# Patient Record
Sex: Female | Born: 1935 | Race: White | Hispanic: No | Smoking: Former smoker
Health system: Southern US, Community
[De-identification: ages and names within clinical notes are randomized; demographics above are authoritative.]

## PROBLEM LIST (undated history)

## (undated) DIAGNOSIS — E785 Hyperlipidemia, unspecified: Secondary | ICD-10-CM

## (undated) DIAGNOSIS — F0390 Unspecified dementia without behavioral disturbance: Secondary | ICD-10-CM

## (undated) DIAGNOSIS — I493 Ventricular premature depolarization: Secondary | ICD-10-CM

## (undated) DIAGNOSIS — G629 Polyneuropathy, unspecified: Secondary | ICD-10-CM

## (undated) DIAGNOSIS — Z8679 Personal history of other diseases of the circulatory system: Secondary | ICD-10-CM

## (undated) DIAGNOSIS — F03B0 Unspecified dementia, moderate, without behavioral disturbance, psychotic disturbance, mood disturbance, and anxiety: Secondary | ICD-10-CM

## (undated) DIAGNOSIS — R42 Dizziness and giddiness: Secondary | ICD-10-CM

## (undated) DIAGNOSIS — M1990 Unspecified osteoarthritis, unspecified site: Secondary | ICD-10-CM

## (undated) DIAGNOSIS — G608 Other hereditary and idiopathic neuropathies: Secondary | ICD-10-CM

## (undated) DIAGNOSIS — I739 Peripheral vascular disease, unspecified: Secondary | ICD-10-CM

## (undated) HISTORY — PX: TONSILLECTOMY: SUR1361

## (undated) HISTORY — DX: Hyperlipidemia, unspecified: E78.5

## (undated) HISTORY — DX: Unspecified osteoarthritis, unspecified site: M19.90

## (undated) HISTORY — PX: BUNIONECTOMY: SHX129

## (undated) HISTORY — DX: Unspecified dementia without behavioral disturbance: F03.90

## (undated) HISTORY — DX: Dizziness and giddiness: R42

## (undated) HISTORY — PX: DILATION AND CURETTAGE OF UTERUS: SHX78

## (undated) HISTORY — DX: Peripheral vascular disease, unspecified: I73.9

## (undated) HISTORY — DX: Other hereditary and idiopathic neuropathies: G60.8

## (undated) HISTORY — DX: Ventricular premature depolarization: I49.3

## (undated) HISTORY — DX: Polyneuropathy, unspecified: G62.9

## (undated) HISTORY — DX: Unspecified dementia, moderate, without behavioral disturbance, psychotic disturbance, mood disturbance, and anxiety: F03.B0

## (undated) HISTORY — DX: Personal history of other diseases of the circulatory system: Z86.79

---

## 2003-04-29 ENCOUNTER — Encounter: Admission: RE | Admit: 2003-04-29 | Discharge: 2003-04-29 | Payer: Self-pay | Admitting: Orthopedic Surgery

## 2003-04-30 ENCOUNTER — Ambulatory Visit (HOSPITAL_COMMUNITY): Admission: RE | Admit: 2003-04-30 | Discharge: 2003-04-30 | Payer: Self-pay | Admitting: Orthopedic Surgery

## 2003-04-30 ENCOUNTER — Ambulatory Visit (HOSPITAL_BASED_OUTPATIENT_CLINIC_OR_DEPARTMENT_OTHER): Admission: RE | Admit: 2003-04-30 | Discharge: 2003-04-30 | Payer: Self-pay | Admitting: Orthopedic Surgery

## 2003-12-10 ENCOUNTER — Ambulatory Visit (HOSPITAL_BASED_OUTPATIENT_CLINIC_OR_DEPARTMENT_OTHER): Admission: RE | Admit: 2003-12-10 | Discharge: 2003-12-10 | Payer: Self-pay | Admitting: Orthopedic Surgery

## 2008-01-03 ENCOUNTER — Ambulatory Visit: Payer: Self-pay | Admitting: Family Medicine

## 2008-08-21 ENCOUNTER — Ambulatory Visit: Payer: Self-pay | Admitting: Family Medicine

## 2008-10-10 ENCOUNTER — Ambulatory Visit: Payer: Self-pay | Admitting: Family Medicine

## 2008-10-17 ENCOUNTER — Encounter: Payer: Self-pay | Admitting: Cardiovascular Disease

## 2009-02-11 ENCOUNTER — Ambulatory Visit: Payer: Self-pay | Admitting: Family Medicine

## 2009-09-16 ENCOUNTER — Ambulatory Visit: Payer: Self-pay | Admitting: Family Medicine

## 2009-12-29 ENCOUNTER — Encounter: Admission: RE | Admit: 2009-12-29 | Discharge: 2009-12-29 | Payer: Self-pay | Admitting: Orthopedic Surgery

## 2009-12-31 ENCOUNTER — Ambulatory Visit (HOSPITAL_BASED_OUTPATIENT_CLINIC_OR_DEPARTMENT_OTHER): Admission: RE | Admit: 2009-12-31 | Discharge: 2009-12-31 | Payer: Self-pay | Admitting: Orthopedic Surgery

## 2010-01-09 ENCOUNTER — Ambulatory Visit: Payer: Self-pay | Admitting: Family Medicine

## 2010-04-28 ENCOUNTER — Ambulatory Visit
Admission: RE | Admit: 2010-04-28 | Discharge: 2010-04-28 | Payer: Self-pay | Source: Home / Self Care | Attending: Family Medicine | Admitting: Family Medicine

## 2010-06-25 LAB — BASIC METABOLIC PANEL
CO2: 30 mEq/L (ref 19–32)
Calcium: 9.3 mg/dL (ref 8.4–10.5)
Chloride: 102 mEq/L (ref 96–112)
Creatinine, Ser: 0.54 mg/dL (ref 0.4–1.2)
GFR calc Af Amer: >60 mL/min (ref >60)
Glucose, Bld: 97 mg/dL (ref 70–99)

## 2010-08-28 NOTE — Op Note (Signed)
NAMEJONNA, [name]                        ACCOUNT NO.:  [id number]   MEDICAL RECORD NO.:  [id number]                   PATIENT TYPE:  AMB   LOCATION:  DSC                                  FACILITY:  [facility]   PHYSICIAN:  Leonides Grills, M.D.                  DATE OF BIRTH:  Feb 17, 1925   DATE OF PROCEDURE:  12/10/2003  DATE OF DISCHARGE:                                 OPERATIVE REPORT   PREOPERATIVE DIAGNOSES:  1.  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  2.  Right third toe metatarsophalangeal joint valgus contracture.   POSTOPERATIVE DIAGNOSES:  1.  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  2.  Right third toe metatarsophalangeal joint valgus contracture.   OPERATION:  1.  Right third toe proximal interphalangeal joint fusion.  2.  Stress x-rays, right foot.  3.  Right third toe metatarsophalangeal joint lateral capsular release and      medial capsular imbrication.   ANESTHESIA:  Popliteal block with sedation.   SURGEON:  Leonides Grills, M.D.   ANESTHESIA:  Lianne Cure, P.A.   ESTIMATED BLOOD LOSS:  Minimal.   TOURNIQUET TIME:  Approximately one-half hour.   COMPLICATIONS:  None.   DISPOSITION:  Stable to PAR.   INDICATIONS:  This is a 77 year old female who had a previous hammer toe  reconstruction in the past and developed the above deformity.  It was to the  point that it was interfering with her life to the point that she could not  do what she wants to do.  She was consented for the above procedure.  All  risks, which include infection, nerve or vessel injury, recurrence of  deformity, persisting pain, worsening pain, ischemia of the toe, and  possible amputation, were all explained, questions were encouraged and  answered.   OPERATION:  The patient was brought to the operating room and placed in the  supine position after adequate general endotracheal tube anesthesia was  administered as well as Ancef 1 g IV piggyback.  The  right lower extremity  was then prepped and draped in a sterile manner over a proximally-placed  thigh tourniquet.  The limb was gravity-exsanguinated and the tourniquet was  elevated to 290 mmHg.  A longitudinal incision dorsally over the right third  toe was made.  Dissection was carried down through skin.  There was a  tremendous amount of scar, and this was then meticulously dissected out both  medially and laterally.  The extensor tendon apparatus was then preserved.  This was elevated off the dorsal aspect of the PIP joint and proximal  phalanx and MTP joint.  There was a rotational deformity component to the  proximal phalanx.  MTP joint capsulotomy and collateral release was then  performed of the MTP joint.  This had excellent release of the contracture.  We then entered the PIP joint.  This was scarred into varus.  A  partial  excision of the proximal phalanx head was then performed to where it was  perpendicular to the line of the toe.  The bone was prepared for fusion on  both the base of the middle phalanx and the proximal phalanx head area,  where this resection took place.  We then repaired the medial collateral  ligament of the MTP joint with 3-0 Vicryl suture.  This had an excellent  repair and without the wire in place held the tone in an excellent position  and no-touch technique.  We then placed a 0.045 K-wire antegrade through the  middle and distal phalanx and reduced the PIP joint, compressed it, and then  fired the K-wire retrograde through the proximal phalanx and then with the  toe held in the proper position, fired the K-wire across the MTP joint.  We  then obtained stress x-rays, which showed a stable construct.  There was no  gross motion at the arthrodesis site, and the K-wire and toe were in proper  position.  Rotation of the proximal phalanx was excellent as well.  The  tourniquet was deflated, hemostasis was obtained.  Skin-relieving incisions  were made on  either side of the K-wire.  The wound was copiously irrigated  with normal saline, the skin was closed with 4-0 nylon.  The K-wires were  bent, cut, and capped.  A sterile dressing was applied, a hard-soled shoe  was applied.  The patient was stable to the PAR.                                               Leonides Grills, M.D.    PB/MEDQ  D:  12/10/2003  T:  12/10/2003  Job:  [id number]

## 2010-08-28 NOTE — Op Note (Signed)
NAMECHRYSTEN, [name]                        ACCOUNT NO.:  [id number]   MEDICAL RECORD NO.:  [id number]                   PATIENT TYPE:  AMB   LOCATION:  DSC                                  FACILITY:  [facility]   PHYSICIAN:  Leonides Grills, M.D.                  DATE OF BIRTH:  01/03/1925   DATE OF PROCEDURE:  04/30/2003  DATE OF DISCHARGE:                                 OPERATIVE REPORT   PREOPERATIVE DIAGNOSES:  1. Bilateral tight gastrocs.  2. Bilateral second to fifth hammer toes.  3. Bilateral proximal phalanx head spurs.  4. Left great toe metatarsophalangeal joint contracture.   POSTOPERATIVE DIAGNOSES:  1. Bilateral tight gastrocs.  2. Bilateral second to fifth hammer toes.  3. Bilateral proximal phalanx head spurs.  4. Left great toe metatarsophalangeal joint contracture.   OPERATION PERFORMED:  1. Bilateral gastroc slides.  2. Bilateral great toe partial excision, proximal phalanx heads.  3. Left dorsal great toe metatarsophalangeal joint capsulotomy and release.  4. Bilateral second through fifth toes metatarsophalangeal joint dorsal     capsulotomy with collateral release.  5. Bilateral second through fifth toes proximal phalanx head resections.  6. Bilateral second through fifth toes flexor digitorum longus to proximal     phalanx transfer.  7. Bilateral second through fifth toes extensor digitorum brevis to extensor     digitorum longus transfers.   SURGEON:  Leonides Grills, M.D.   ASSISTANT:  Lianne Cure, P.A.   ANESTHESIA:  General endotracheal tube.   ESTIMATED BLOOD LOSS:  Minimal.   TOURNIQUET TIME:  Approximately one hour and 15 minutes on the right and one  hour and 20 minutes on the left.   DISPOSITION:  Stable to PR.   INDICATIONS FOR PROCEDURE:  The patient is a 77 year old female with  longstanding forefoot pain that was interfering with her life to the point  where she cannot do what she wants to do despite shoe modification.  The  patient   has consented for the above procedure.  All risks which include  infection, neurovascular injury, recurrence of the deformity, persistent  pain, worsening of pain, stiffness, ischemia and possible amputation of the  toe were all explained, questions were encouraged and answered.   DESCRIPTION OF PROCEDURE:  The patient was brought to the operating room and  placed in supine position after adequate general endotracheal tube  anesthesia was administered as well as Ancef 1 g IV piggyback.  Bilateral  lower extremities were prepped and draped in sterile manner over a  proximally placed thigh tourniquet.  We started the procedure with a  longitudinal incision over the right medial head of the gastroc.  Dissection  was carried down through skin.  Hemostasis was obtained.  Fascia was opened  in line with the incision.  Interval between the contact region of the  soleus and gastroc was then developed.  Soft tissue was then elevated off  the posterior aspect of the gastrocnemius.  Sural nerve was identified and  protected posteriorly.  Gastrocnemius was then released with the Mayo  scissors. This had excellent release.  The tight gastroc wound was copiously  irrigated with normal saline.  The subcutaneous was closed with 3-0 Vicryl,  the skin was closed with 4-0 Monocryl subcuticular stitch.  The same exact  procedure was done on the left side as described above.  We then gravity  exsanguinated the right lower extremity and tourniquet was elevated to 290  mmHg.  A longitudinal incision over the dorsal medial aspect of the great  toe interphalangeal joint was then made.  Dissection was carried out through  skin.  Capsule was then opened.  Spur was then identified.  With sharp  dissection around the spur and once this was dissected out, we then removed  it with a rongeur.  This was then palpated through the skin to be adequately  decompressed.  The wound was then copiously irrigated with normal  saline.  The skin was  closed with 4-0 nylon suture.  We then made a longitudinal incision over the  second toe.  This had a previous surgery prior.  The PIP joint was solidly  fused as well.  Once we made the incision, the extensor apparatus was then  identified.  There was a large amount of scar tissue in this area.  The  extensor digitorum brevis was tenotomized distal lateral and the extensor  digitorum longus proximal and medial.  This was then retracted out of harm's  way.  The MTP joint was then identified and dorsal capsulotomy collateral  release was performed with a 15 blade scalpel protecting soft tissues and  neurovascular structures both medially and laterally.  I then identified the  PIP joint and made an osteotomy through this area with a rongeur.  Once this  was developed and we found the plantar plate where it was previously and  longitudinal incision was made in the plantar plate.  The FPL tendon was  then identified.  The distal aspect of the proximal phalanx was also  skeletonized as well.  The FDL tendon was identified and tenotomized as  distal as possible within the wound.  We then made two sequential drill  holes with a 2.5 mm and 3.5 mm drill, respectively.  We then transferred the  FDL tendon through the drill hole from plantar to dorsal using 3-0 PDS  suture.  We then placed a K-wire antegrade through the middle and distal  phalanx.  We then reduced the PIP joint and placed the K-wire retrograde  through the proximal phalanx.  The toe was held in reduced position with the  ankle in neutral dorsiflexion, tension to the FPL tendon through the drill  hole.  This was then advanced across the MTP joint with the toe held in  reduced position.  This had excellent position of the toe.  We did the exact  same procedure through separate incisions through the third, fourth and fifth toes respectively on the right foot as well as the second, third,  fourth and fifth toes of  the left foot with the tourniquet elevated.  We  also on the left foot with tourniquet elevated, made a longitudinal incision  over the medial aspect of the great toe MTP joint.  Dissection was carried  down through the skin.  Capsule was then opened.  Soft tissue was then  elevated off the dorsal aspect within the joint and the  capsule was then  released taking care not to violate the FHL tendon. This had excellent  release of the tight capsule.  We then placed a 2.0 mm K-wire retrograde  from the distal and proximal phalanx across the MTP joint to hold it in  reduced position.  At the end of each foot procedure, once the foot was  completed, the tourniquet was deflated.  Toes pinked up nicely.  Skin  relieving incisions were made over every single K-wire.  K-wires were bent,  cut and capped.  All wounds were closed with 4-0 nylon suture.  Sterile  dressing was applied.  Cam walker boots were applied.  The patient was  stable to the PR.                                               Leonides Grills, M.D.    PB/MEDQ  D:  04/30/2003  T:  05/01/2003  Job:  [id number]

## 2010-09-16 ENCOUNTER — Encounter: Payer: Self-pay | Admitting: Medical

## 2010-09-16 ENCOUNTER — Ambulatory Visit (INDEPENDENT_AMBULATORY_CARE_PROVIDER_SITE_OTHER): Payer: MEDICARE | Admitting: Medical

## 2010-09-16 VITALS — BP 130/80 | HR 69 | Temp 97.8°F | Wt 124.0 lb

## 2010-09-16 DIAGNOSIS — M542 Cervicalgia: Secondary | ICD-10-CM

## 2010-09-16 DIAGNOSIS — R51 Headache: Secondary | ICD-10-CM

## 2010-09-16 DIAGNOSIS — W19XXXA Unspecified fall, initial encounter: Secondary | ICD-10-CM

## 2010-09-16 LAB — POCT URINALYSIS DIPSTICK
Glucose, UA: NEGATIVE
Leukocytes, UA: NEGATIVE
Nitrite, UA: NEGATIVE
Urobilinogen, UA: NEGATIVE

## 2010-09-16 LAB — COMPREHENSIVE METABOLIC PANEL
Albumin: 4.1 g/dL (ref 3.5–5.2)
BUN: 12 mg/dL (ref 6–23)
CO2: 29 mEq/L (ref 19–32)
Glucose, Bld: 93 mg/dL (ref 70–99)
Sodium: 137 mEq/L (ref 135–145)
Total Bilirubin: 0.5 mg/dL (ref 0.3–1.2)
Total Protein: 6.3 g/dL (ref 6.0–8.3)

## 2010-09-16 LAB — CBC WITH DIFFERENTIAL/PLATELET
Hemoglobin: 13.4 g/dL (ref 12.0–15.0)
Lymphocytes Relative: 17 % (ref 12–46)
Lymphs Abs: 1.4 10*3/uL (ref 0.7–4.0)
MCV: 94.2 fL (ref 78.0–100.0)
Monocytes Relative: 11 % (ref 3–12)
Neutrophils Relative %: 70 % (ref 43–77)
Platelets: 250 10*3/uL (ref 150–400)
RBC: 4.29 MIL/uL (ref 3.87–5.11)
WBC: 8.3 10*3/uL (ref 4.0–10.5)

## 2010-09-16 LAB — TSH: TSH: 1.216 u[IU]/mL (ref 0.350–4.500)

## 2010-09-16 MED ORDER — TIZANIDINE HCL 4 MG PO TABS: 4.0000 mg | ORAL_TABLET | Freq: Every evening | ORAL | Status: DC | PRN

## 2010-09-16 NOTE — Progress Notes (Addendum)
Subjective:   HPI  [name] is a 77 y.o. female who presents for headache.  She lives in [facility].  She is here alone today.  She notes 3 days ago having a headache on the right side of her head.  She is worried as she thinks something is wrong.  She notes that she normally can tell when there is a change in her body.  On Monday she took OTC extra strength pain reliever which seemed to improve the headache by that evening.  She had no headache all day Tuesday, but then awoke with headache again on the right side of her head again this morning.  The headache is not severe, but concerns her.  She does see an eye doctor and has had recent eye exam with change in her glasses prescription.  She also notes some intermittent mild pain in her right neck recently.  She slept in a different position than her usual, an may have laid on her neck in different way.  Moving her neck can worsen the pain.    She notes hx/o 2-3 falls in the last few months.  Has a cane but doesn't use this regularly. She notes hx/o neuropathy, but no new focal neurologic changes recently such as numbness, vision or hearing changes, weakness, etc.  No other aggravating or relieving factors.  No other c/o.    Review of Systems Constitutional: denies fever, chills, sweats, unexpected weight change, anorexia, fatigue Allergy: negative; denies recent sneezing, itching, congestion Dermatology: +losing hair generalized for few years, thought it was stress related;  denies changing moles, rash, lumps, new worrisome lesions ENT: no runny nose, ear pain, sore throat, hoarseness, sinus pain, teeth pain, tinnitus, hearing loss, epistaxis Cardiology: +rare brief sharp mid chest pain.  denies palpitations, edema, orthopnea, paroxysmal nocturnal dyspnea Respiratory: +mild change in breathing over the last few years.   denies cough, dyspnea on exertion, wheezing, hemoptysis Gastroenterology: denies abdominal pain,  nausea, vomiting, diarrhea, constipation, blood in stool, changes in bowel movement, dysphagia Hematology: denies bleeding or bruising problems Musculoskeletal: denies arthralgias, myalgias, joint swelling, back pain, neck pain, cramping, gait changes Ophthalmology: denies vision changes, eye redness, itching, discharge Urology: denies dysuria, difficulty urinating, hematuria, urinary frequency, urgency, incontinence Neurology: +chronic >1 year numbness of anterior lower legs, "catch" in her right leg causing limp;mild memory changes, +right occipital headache;  no weakness, speech abnormality, dizziness Psychology: denies depressed mood, agitation, sleep problems     Objective:   Physical Exam  General appearance: alert, no distress, WD/WN, white female HEENT: normocephalic, conjunctiva/corneas normal, sclerae anicteric, PERRLA, EOMi, nares patent, no discharge or erythema, pharynx normal Oral cavity: MMM, tongue normal, teeth normal Neck: +mild tenderness of right lateral neck, mild pain with neck ROM with flexion and rotation to the left, otherwise supple, no lymphadenopathy, no thyromegaly, no masses, no JVD, no bruits Heart: RRR, normal S1, S2, no murmurs Lungs: CTA bilaterally, no wheezes, rhonchi, or rales Back: non tender, normal ROM, no scoliosis Musculoskeletal: upper extremities non tender, no obvious deformity, normal ROM throughout, lower extremities non tender, no obvious deformity, normal ROM throughout Extremities: no edema, no cyanosis, no clubbing Pulses: 2+ symmetric, upper and lower extremities Neurological: alert, oriented x 3, CN2-12 intact, strength normal upper extremities and lower extremities, DTRs 1+ throughout, no cerebellar signs, Romberg negative.  Antalgic gait , chronic per pt due to neuropathy and right leg problems.  Psychiatric: normal affect, behavior normal, pleasant      Assessment :  Encounter Diagnoses  Name Primary?  . Headache Yes  . Fall     . Cervicalgia       Plan:    MMSE 28/30 today.    Headache and cervicalgia - Advised that symptoms and exam currently suggestive of headache related to neck tension and cervicalgia vs other worrisome cause.  Advised rest, heat, Tizanidine QHS prn, OTC Tylenol or Aleve prn.  Labs today to further eval, recheck 2wk.  If headaches worse or continue, can consider head imaging.  Fall - advised to use her cane, discussed risks of falls, and discussed fall prevention.

## 2010-09-16 NOTE — Patient Instructions (Signed)
Use your cane to avoid fall.  Use OTC Tylenol or Aleve for headache.   Use muscle relaxer at bedtime for neck tension.  Recheck in 10 days, but return sooner if much worse or new symptoms.

## 2010-09-18 ENCOUNTER — Telehealth: Payer: Self-pay | Admitting: Medical

## 2010-09-18 NOTE — Telephone Encounter (Signed)
Is the neck the main issue currently, or is she still having the same headache pain?  Did the muscle relaxer help?  How often is she taking Aleve or Tylenol?  In general, is she worse, better, or the same?

## 2010-09-21 ENCOUNTER — Telehealth: Payer: Self-pay | Admitting: *Deleted

## 2010-09-21 NOTE — Telephone Encounter (Signed)
Pt is feeling some better. Going out of town for a week and will call to make appointment when she returns.  Has been taking muscle relaxer and it seems to help some.  CM, LPN

## 2010-09-21 NOTE — Telephone Encounter (Addendum)
Message copied by Dorthula Perfect on Mon Sep 21, 2010 12:38 PM ------      Message from: Aleen Campi, DAVID S      Created: Thu Sep 17, 2010  8:17 AM       All of her labs were normal for blood counts, kidney, liver, lytes, urine.  Thus, c/t plan to try the Tizanidine muscle relaxer at night, OTC Aleve or Tylenol for headache, and recheck in 2wk.  However, call/return sooner if worsening or new symptoms that concern her.    Pt notified of lab results.  Pt states that she has been taking Tizanidine muscle relaxer at night for the neck pain.  Pt is going out of town for a week and will call when she returns to schedule a follow up visit.  CM, LPN

## 2010-10-09 ENCOUNTER — Ambulatory Visit (INDEPENDENT_AMBULATORY_CARE_PROVIDER_SITE_OTHER): Payer: MEDICARE | Admitting: Medical

## 2010-10-09 ENCOUNTER — Encounter: Payer: Self-pay | Admitting: Medical

## 2010-10-09 VITALS — BP 130/72 | HR 64 | Temp 98.8°F | Ht 65.0 in | Wt 130.0 lb

## 2010-10-09 DIAGNOSIS — I998 Other disorder of circulatory system: Secondary | ICD-10-CM

## 2010-10-09 DIAGNOSIS — R51 Headache: Secondary | ICD-10-CM

## 2010-10-09 DIAGNOSIS — R300 Dysuria: Secondary | ICD-10-CM

## 2010-10-09 DIAGNOSIS — R21 Rash and other nonspecific skin eruption: Secondary | ICD-10-CM

## 2010-10-09 DIAGNOSIS — R58 Hemorrhage, not elsewhere classified: Secondary | ICD-10-CM

## 2010-10-09 LAB — POCT URINALYSIS DIPSTICK
Leukocytes, UA: NEGATIVE
Nitrite, UA: NEGATIVE
Spec Grav, UA: 1.01
Urobilinogen, UA: NEGATIVE

## 2010-10-09 NOTE — Progress Notes (Signed)
Subjective:   HPI  [name] is a 77 y.o. female who presents for recheck on headache.  She lives in [facility].  She is here alone today.  I saw her on 09/16/10 for new c/o headache on the right side of her head.  She notes occasional neck tension, mild pain with turning her neck to the left that radiates up the scalp.  We had her try some muscle relaxer and OTC analgesia at last visit.  We had also gotten some baseline labs.  Since last visit she has had no more headache. She does get occasional mild pain in her neck on the right, worse with rotation of the neck.  She use to do Yoga and some weight training, but not of recent.  There are facilities where she lives but she hasn't used them regularly.  She also c/o rash on her left leg.  Denies injury or trauma.  She does report some mild urinary discomfort.   She notes hx/o 2-3 falls in the last few months.  Has a cane but doesn't use this regularly. She notes hx/o neuropathy, but no new focal neurologic changes recently such as numbness, vision or hearing changes, weakness, etc.  No other aggravating or relieving factors.  No other c/o.   Past Medical History  Diagnosis Date  . Dyslipidemia   . Alopecia   . Arthritis   . Hereditary peripheral neuropathy     neurology eval 3/12  . Hereditary neuropathy with pressure palsies (HNPP)     Review of Systems Constitutional: denies fever, chills, sweats, unexpected weight change, anorexia, fatigue Allergy: negative; denies recent sneezing, itching, congestion Dermatology: +losing hair generalized for few years, thought it was stress related;  denies changing moles, rash, lumps, new worrisome lesions ENT: no runny nose, ear pain, sore throat, hoarseness, sinus pain, teeth pain, tinnitus, hearing loss, epistaxis Cardiology: +rare brief sharp mid chest pain.  denies palpitations, edema, orthopnea, paroxysmal nocturnal dyspnea Respiratory: +mild change in breathing over the  last few years.   denies cough, dyspnea on exertion, wheezing, hemoptysis Gastroenterology: denies abdominal pain, nausea, vomiting, diarrhea, constipation, blood in stool, changes in bowel movement, dysphagia Hematology: denies bleeding or bruising problems Musculoskeletal: denies arthralgias, myalgias, joint swelling, back pain, neck pain, cramping, gait changes Ophthalmology: denies vision changes, eye redness, itching, discharge Urology: denies dysuria, difficulty urinating, hematuria, urinary frequency, urgency, incontinence Neurology: +chronic >1 year numbness of anterior lower legs, "catch" in her right leg causing limp;mild memory changes, +right occipital headache;  no weakness, speech abnormality, dizziness Psychology: denies depressed mood, agitation, sleep problems     Objective:   Physical Exam  General appearance: alert, no distress, WD/WN, white female Skin: left upper leg just proximal to knee joint with 1.5 cm round erythematous lesion, blanches and appears to be ecchymosis but the somewhat heterogenous and round uniform appearance looks somewhat atypical and more like tinea HEENT: normocephalic, conjunctiva/corneas normal, sclerae anicteric, PERRLA, EOMi, nares patent, no discharge or erythema, pharynx normal Oral cavity: MMM, tongue normal, teeth normal Neck: +mild tenderness of right lateral neck, mild pain with neck ROM with flexion and rotation to the left, otherwise supple, no lymphadenopathy, no thyromegaly, no masses, no JVD, no bruits Heart: RRR, normal S1, S2, no murmurs Lungs: CTA bilaterally, no wheezes, rhonchi, or rales Extremities: no edema, no cyanosis, no clubbing Pulses: 2+ symmetric, upper and lower extremities Neurological: alert, oriented x 3, CN2-12 intact   Assessment :    Encounter Diagnoses  Name  Primary?  . Headache Yes  . Ecchymosis   . Dysuria   . Rash       Plan:  Headache  - resolved.  I suspect she has some cervical arthritis  causing some of the symptoms.  Advised she get back into low weight strength training and Yoga at the retirement center.  Rash - KOH negative, thus, rash actually appears to be ecchymosis although initially it seems a bit ambiguous.  Ecchymosis - advised ice pack prn, and this should gradually resolve.  Dysuria - no significant findings on U/A today.   Fall - advised to use her cane, discussed risks of falls, and discussed fall prevention.

## 2010-10-13 ENCOUNTER — Encounter: Payer: Self-pay | Admitting: Family Medicine

## 2010-11-20 ENCOUNTER — Ambulatory Visit (INDEPENDENT_AMBULATORY_CARE_PROVIDER_SITE_OTHER): Payer: MEDICARE | Admitting: Medical

## 2010-11-20 ENCOUNTER — Encounter: Payer: Self-pay | Admitting: Medical

## 2010-11-20 VITALS — BP 120/76 | HR 60 | Temp 98.7°F | Resp 16 | Ht 65.0 in | Wt 129.0 lb

## 2010-11-20 DIAGNOSIS — J069 Acute upper respiratory infection, unspecified: Secondary | ICD-10-CM

## 2010-11-20 MED ORDER — AZITHROMYCIN 250 MG PO TABS: ORAL_TABLET | ORAL | Status: AC

## 2010-11-20 NOTE — Progress Notes (Signed)
  Subjective:   HPI  LASHAE Trujillo is a 77 y.o. female who presents for c/o mild "flu like symptoms".  Bad runny nose since yesterday, had bad cough with yellow productive sputum.   Using something OTC for runny nose but not sure of the name.  Its a one a day pill.  No other aggravating or relieving factors.  No other c/o.  The following portions of the patient's history were reviewed and updated as appropriate: allergies, current medications, past family history, past medical history, past social history, past surgical history and problem list.  Past Medical History  Diagnosis Date  . Dyslipidemia   . Alopecia   . Arthritis   . Hereditary peripheral neuropathy     neurology eval 3/12  . Hereditary neuropathy with pressure palsies (HNPP)     Review of Systems Constitutional: +anorexia; denies fever, chills, sweats, unexpected weight change ENT: +runny nose;  no ear pain, sore throat, hoarseness, sinus pain Cardiology: denies chest pain, palpitations, edema Respiratory: +cough with some production; denies shortness of breath, wheezing Gastroenterology: denies abdominal pain, nausea, vomiting, diarrhea, constipation Musculoskeletal: denies arthralgias, myalgias, joint swelling, back pain Urology: denies dysuria, difficulty urinating, hematuria, urinary frequency, urgency     Objective:   Physical Exam  General appearance: alert, no distress, WD/WN, white female, mildly ill appearing HEENT: normocephalic, sclerae anicteric, PERRLA, EOMi, nares patent, no discharge or erythema, pharynx normal Oral cavity: MMM, no lesions Neck: supple, no lymphadenopathy, no thyromegaly, no masses Heart: RRR, normal S1, S2, no murmurs Lungs: CTA bilaterally, no wheezes, rhonchi, or rales Abdomen: +bs, soft, non tender, non distended, no masses, no hepatomegaly, no splenomegaly Extremities: no edema, no cyanosis, no clubbing Pulses: 2+ symmetric, upper and lower extremities, normal cap  refill Neurological: alert, oriented x 3 Psychiatric: normal affect, behavior normal, pleasant    Assessment :    Encounter Diagnosis  Name Primary?  . URI (upper respiratory infection) Yes     Plan:    Currently etiology seems viral.  Exam is normal.   Discussed possible etiologies and treatment.  Patient Instructions  Rest, hydrate well, use OTC Mucinex DM or Coricidin HBP.  If worse cough and productive sputum over the weekend, begin Azithromycin antibiotic.  Call Monday if not better.

## 2010-11-20 NOTE — Patient Instructions (Signed)
Rest, hydrate well, use OTC Mucinex DM or Coricidin HBP.  If worse cough and productive sputum over the weekend, begin Azithromycin antibiotic.  Call Monday if not better.

## 2010-12-11 ENCOUNTER — Encounter: Payer: Self-pay | Admitting: Family Medicine

## 2011-01-20 ENCOUNTER — Encounter: Payer: Self-pay | Admitting: Family Medicine

## 2011-01-20 ENCOUNTER — Ambulatory Visit (INDEPENDENT_AMBULATORY_CARE_PROVIDER_SITE_OTHER): Payer: MEDICARE | Admitting: Family Medicine

## 2011-01-20 VITALS — BP 150/84 | HR 60 | Ht 65.0 in | Wt 134.0 lb

## 2011-01-20 DIAGNOSIS — R030 Elevated blood-pressure reading, without diagnosis of hypertension: Secondary | ICD-10-CM

## 2011-01-20 DIAGNOSIS — R42 Dizziness and giddiness: Secondary | ICD-10-CM

## 2011-01-20 DIAGNOSIS — H6120 Impacted cerumen, unspecified ear: Secondary | ICD-10-CM

## 2011-01-20 NOTE — Patient Instructions (Signed)
You may take meclizine (Bonine is a brand of this medication).  It is over the counter, available in 12.5 mg.  Since I think it may have made you hyper in the past, you can try taking just 1/2 tablet.  You may take it every 6-8 hours--take it only if you need it for dizziness/balance problems/vertigo.    You may try taking Claritin (plain, NOT the "D") if you continue to have any runny nose, congestion.  You may also continue the Mucinex DM if needed for ongoing cough.  You have Toprol XL listed on your medication list.  It sounds as though you are not taking this medication.  Please check your bottles and see who originally prescribed this for you.  I do not know if you need to restart it.  Today, your blood pressure was a little high.  Follow up with Dr. Susann Givens in 2 weeks for a repeat blood pressure, to help [place] decide if, in fact, you need to take this medication.

## 2011-01-20 NOTE — Progress Notes (Signed)
Patient presents, accompanied by her son, with balance problems x 2 days. Awoke Monday morning with a "swimmy head" and a "buzzing" and "going in circles" on the top of her head.  Didn't last for long, was better by the time she went to the bathroom.  Continues to have some balance problem--further classified as "can't walk well" and scared she's going to fall.  She has neuropathy and limps with her right leg.  She doesn't think the neuropathy has anything to do with her walking problem.  Thinks it is related to her balance. Recalls having similar problem in the past (she states 3 years ago).  Chart was reviewed--similar problem with short-lived vertigo occurred the same time last year.  Previously saw 2 neurologists--didn't like the one in [place] who did a lot of tests and told her to f/u in 6 months.  So her son took her to [facility] instead.  Bottom of her legs are numb, and she can't dorsiflex it fully with right foot.  Sh denies any pain from her neuropathy.  Having some URI symptoms x 2 days.  Runny nose, sore throat just in the mornings (relieved by gargling).  Some cough. Denies ear pain, sinus pain or pressure, shortness of breath or wheezes, tinnitus.  Denies nausea, vomiting or diarrhea.  Denies skin rash. Taking Mucinex DM which seems to be helping dry up the mucus.  Chart was reviewed, med list reviewed.  Patient states she stopped taking the propecia for alopecia because it wasn't really working.  She stopped the Zocor because of concerns of it affecting her brain/thinking.  She doesn't recall much about the Toprol XL--states she isn't taking it now, doesn't recall who prescribed it (noted in neuro's records from 06/2010).  States that her BP was normal last week at the oral surgeon's office.  Her son (and his wife) had some concerns about her speech, concern regarding other relatives with stroke  Past Medical History  Diagnosis Date  . Dyslipidemia   . Alopecia   . Arthritis   .  Hereditary peripheral neuropathy     neurology eval 3/12  . Hereditary neuropathy with pressure palsies (HNPP)     No past surgical history on file.  History   Social History  . Marital Status: Widowed    Spouse Name: N/A    Number of Children: N/A  . Years of Education: N/A   Occupational History  . Not on file.   Social History Main Topics  . Smoking status: Never Smoker   . Smokeless tobacco: Not on file  . Alcohol Use: No  . Drug Use: No  . Sexually Active:    Other Topics Concern  . Not on file   Social History Narrative  . No narrative on file   Current outpatient prescriptions:Ascorbic Acid (VITAMIN C) 1000 MG tablet, Take 1,000 mg by mouth daily.  , Disp: , Rfl: ;  aspirin 325 MG tablet, Take 325 mg by mouth daily.  , Disp: , Rfl: ;  Biotin 5000 MCG CAPS, Take 1 capsule by mouth.  , Disp: , Rfl: ;  calcium citrate-vitamin D 200-200 MG-UNIT TABS, Take 1 tablet by mouth 2 (two) times a week.  , Disp: , Rfl:  co-enzyme Q-10 30 MG capsule, Take 50 mg by mouth 1 dose over 24 hours.  , Disp: , Rfl: ;  COD LIVER OIL PO, Take 1 capsule by mouth 1 dose over 24 hours.  , Disp: , Rfl: ;  folic acid (FOLVITE)  800 MCG tablet, Take 400 mcg by mouth daily.  , Disp: , Rfl: ;  Garlic 1000 MG CAPS, Take 1 capsule by mouth.  , Disp: , Rfl: ;  Glucosamine-Chondroit-Vit C-Mn (GLUCOSAMINE 1500 COMPLEX PO), Take 2 capsules by mouth 1 dose over 24 hours.  , Disp: , Rfl:  metoprolol succinate (TOPROL-XL) 25 MG 24 hr tablet, Take 25 mg by mouth daily.  , Disp: , Rfl: ;  Multiple Vitamins-Minerals (MATURE ADULT CENTURY PO), Take 1 capsule by mouth.  , Disp: , Rfl: ;  Multiple Vitamins-Minerals (OCUVITE EYE HEALTH FORMULA) CAPS, Take 2 capsules by mouth 2 (two) times daily.  , Disp: , Rfl: ;  finasteride (PROSCAR) 5 MG tablet, Take 5 mg by mouth daily. 1/4 tablet daily (for hair loss), Disp: , Rfl:  tiZANidine (ZANAFLEX) 4 MG tablet, Take 1 tablet (4 mg total) by mouth at bedtime as needed., Disp: 20  tablet, Rfl: 0  No Known Allergies  ROS:  Denies fevers, shortness of breath, chest pain, nausea, vomiting, diarrhea, skin rash.  Denies pain.  See HPI  PHYSICAL EXAM: BP 150/84  Pulse 60  Ht 5\' 5"  (1.651 m)  Wt 134 lb (60.782 kg)  BMI 22.30 kg/m2 Pleasant, elderly female in no distress. HEENT: PERRL, EOMI, conjunctiva clear.  TM's and EAC's--normal on right.  Obscured by cerumen left.  After lavage and removal with curette, TM was visualized and was normal.  OP clear without erythema.  Sinuses nontender; nasal mucosa mildly edematous, no erythema or purulence. Neck: no lymphadenopathy or mass Heart: regular rate and rhythm without murmur Lungs: clear bilaterally Extremities: no edema Neuro: alert and oriented.  Cranial nerves 2-12 intact.  Normal strength except diminished in both hands/fingers, and significant decrease in dorsiflexion R foot.  Negative Romberg, no pronator drift.  Normal finger to nose.  Speech is fluent, understandable.  No trouble with word finding or other speech abnormality noted today Skin: no rash  ASSESSMENT/PLAN:  1. Vertigo    2. Cerumen impaction  Ear cerumen removal  3. Elevated BP     ? history of HTN, ? whether she is supposed to be taking Toprol XL (hasn't been)   Meclizine prn.  Claritin prn congestion, continue Mucinex DM prn cough F/u 1-2 weeks with Dr. Susann Givens, her regular physician, on her blood pressure (and ? Need for Toprol).  Also, to further address son's concerns about patient's speech.  Reassured today

## 2011-01-25 ENCOUNTER — Ambulatory Visit: Payer: PRIVATE HEALTH INSURANCE | Admitting: Family Medicine

## 2011-01-29 ENCOUNTER — Ambulatory Visit (INDEPENDENT_AMBULATORY_CARE_PROVIDER_SITE_OTHER): Payer: MEDICARE | Admitting: Family Medicine

## 2011-01-29 ENCOUNTER — Encounter: Payer: Self-pay | Admitting: Family Medicine

## 2011-01-29 VITALS — BP 140/90 | HR 64 | Wt 133.0 lb

## 2011-01-29 DIAGNOSIS — R413 Other amnesia: Secondary | ICD-10-CM

## 2011-01-29 DIAGNOSIS — R42 Dizziness and giddiness: Secondary | ICD-10-CM

## 2011-01-29 DIAGNOSIS — R269 Unspecified abnormalities of gait and mobility: Secondary | ICD-10-CM

## 2011-01-29 LAB — CBC WITH DIFFERENTIAL/PLATELET
Basophils Absolute: 0 10*3/uL (ref 0.0–0.1)
Eosinophils Relative: 3 % (ref 0–5)
Lymphocytes Relative: 19 % (ref 12–46)
MCV: 94.3 fL (ref 78.0–100.0)
Platelets: 304 10*3/uL (ref 150–400)
RDW: 13.3 % (ref 11.5–15.5)
WBC: 8.7 10*3/uL (ref 4.0–10.5)

## 2011-01-29 LAB — COMPREHENSIVE METABOLIC PANEL
ALT: 14 U/L (ref 0–35)
AST: 21 U/L (ref 0–37)
Calcium: 9.9 mg/dL (ref 8.4–10.5)
Chloride: 99 mEq/L (ref 96–112)
Creat: 0.61 mg/dL (ref 0.50–1.10)
Total Bilirubin: 0.4 mg/dL (ref 0.3–1.2)

## 2011-01-29 MED ORDER — METOPROLOL SUCCINATE ER 25 MG PO TB24: 25.0000 mg | ORAL_TABLET | Freq: Every day | ORAL | Status: DC

## 2011-01-29 NOTE — Progress Notes (Signed)
  Subjective:    Patient ID: [name], female    DOB: 10-15-1924, 77 y.o.   MRN: [id number]  HPI She is here for a recheck. She continues to have difficulty with word searching and memory loss which has been intermittent in nature.. She's also been unsteady on her feet. This has apparently been going on for at least a month. She and her son and daughter-in-law at all noticed this. She also states that she finds herself leaning against the wall to prevent herself from falling backwards. She is on multiple medications and asked me to review them. I recommended that she stop several of the OTC meds that were of questionable value.  Review of Systems     Objective:   Physical Exam alert and in no distress. Tympanic membranes and canals are normal. Throat is clear. Tonsils are normal. Neck is supple without adenopathy or thyromegaly. Cardiac exam shows a regular sinus rhythm without murmurs or gallops. Lungs are clear to auscultation. Cerebellar testing did note falling backwards. Reflexes are normal. EOMI.       Assessment & Plan:   1. Memory loss or impairment  MR Brain W Wo Contrast, CBC with Differential, Comprehensive metabolic panel, TSH, Folate, B12  2. Postural dizziness  MR Brain W Wo Contrast, TSH, Folate, B12  3. Gait disturbance  MR Brain W Wo Contrast, TSH, Folate, B12  4. Hypertension. Continue metoprolol. Prescription was called in.

## 2011-01-29 NOTE — Patient Instructions (Signed)
I called in your metoprolol. We will call you with the results of the blood work and the MRI when it is done

## 2011-02-01 ENCOUNTER — Telehealth: Payer: Self-pay | Admitting: Family Medicine

## 2011-02-01 NOTE — Telephone Encounter (Signed)
Okay fracture

## 2011-02-01 NOTE — Telephone Encounter (Signed)
Dr Susann Givens can she have xray?

## 2011-02-02 NOTE — Telephone Encounter (Signed)
Added xray to [facility].  Pt aware of MRI & xray.  Faxed to [facility] with labs

## 2011-02-03 ENCOUNTER — Ambulatory Visit
Admission: RE | Admit: 2011-02-03 | Discharge: 2011-02-03 | Disposition: A | Discharge status: Home / Self Care | Payer: MEDICARE | Source: Ambulatory Visit | Attending: Family Medicine | Admitting: Family Medicine

## 2011-02-03 DIAGNOSIS — R269 Unspecified abnormalities of gait and mobility: Secondary | ICD-10-CM

## 2011-02-03 DIAGNOSIS — R413 Other amnesia: Secondary | ICD-10-CM

## 2011-02-03 DIAGNOSIS — R42 Dizziness and giddiness: Secondary | ICD-10-CM

## 2011-02-03 MED ORDER — GADOBENATE DIMEGLUMINE 529 MG/ML IV SOLN
12.0000 mL | Freq: Once | INTRAVENOUS | Status: AC | PRN
  Administered 2011-02-03: 12 mL via INTRAVENOUS

## 2011-02-08 ENCOUNTER — Ambulatory Visit (INDEPENDENT_AMBULATORY_CARE_PROVIDER_SITE_OTHER): Payer: MEDICARE | Admitting: Family Medicine

## 2011-02-08 ENCOUNTER — Encounter: Payer: Self-pay | Admitting: Family Medicine

## 2011-02-08 VITALS — BP 120/90 | HR 42 | Wt 132.0 lb

## 2011-02-08 DIAGNOSIS — M25511 Pain in right shoulder: Secondary | ICD-10-CM

## 2011-02-08 DIAGNOSIS — M25519 Pain in unspecified shoulder: Secondary | ICD-10-CM

## 2011-02-08 DIAGNOSIS — R5383 Other fatigue: Secondary | ICD-10-CM

## 2011-02-08 NOTE — Progress Notes (Signed)
  Subjective:    Patient ID: [name], female    DOB: 25-Mar-1925, 77 y.o.   MRN: [id number]  HPI She is here for consult. She had a recent MRI which was essentially negative. Her main concern today is difficulty with fatigue. She admits to not being very active where she lives. She also is having intermittent right shoulder pain. She has had difficulty with this in the past and apparently has had surgical intervention. I have no report concerning this.   Review of Systems     Objective:   Physical Exam Alert and in no distress. Good motion of the shoulder. No laxity is noted. Hawkin's test did cause some crepitus.       Assessment & Plan:   1. Right shoulder pain   2. Fatigue    recommend conservative care for her shoulder and if continued difficulty, refer to orthopedics. Also encouraged her to become more physically active to help with what is probably deconditioning causing her fatigue.

## 2011-02-08 NOTE — Patient Instructions (Addendum)
do some exercise programs where you live; that should help with your energy and stamina Use Tylenol for your shoulder pain and if it continues ,call me and I will refer you to your orthopedic surgeon

## 2011-05-20 ENCOUNTER — Ambulatory Visit (INDEPENDENT_AMBULATORY_CARE_PROVIDER_SITE_OTHER): Payer: MEDICARE | Admitting: Family Medicine

## 2011-05-20 ENCOUNTER — Encounter: Payer: Self-pay | Admitting: Family Medicine

## 2011-05-20 VITALS — BP 120/80 | HR 60 | Ht 63.0 in | Wt 132.0 lb

## 2011-05-20 DIAGNOSIS — R42 Dizziness and giddiness: Secondary | ICD-10-CM

## 2011-05-20 MED ORDER — METOPROLOL SUCCINATE ER 25 MG PO TB24: 25.0000 mg | ORAL_TABLET | Freq: Every day | ORAL | Status: DC

## 2011-05-20 MED ORDER — MECLIZINE HCL 12.5 MG PO TABS: 12.5000 mg | ORAL_TABLET | Freq: Two times a day (BID) | ORAL | Status: DC | PRN

## 2011-05-20 NOTE — Patient Instructions (Signed)
Use the meclizine but be very careful because it can make you drowsy.

## 2011-05-20 NOTE — Progress Notes (Signed)
  Subjective:    Patient ID: [name], female    DOB: [birth year], 78 y.o.   MRN: [id number]  HPI She is here for evaluation of dizziness. She states she was slightly dizzy yesterday and also had some nausea. She woke up this morning and again had an episode of dizziness but also associated with transient visual changes. Now she is doing better. Review of her record indicates that she's had previous difficulty with dizziness and has had a brain MRI which was essentially negative except for atrophy. She also then noted that she has had difficulty with dizziness intermittently for the last several months. No blood or vision, double vision, headache, weakness, numbness, tingling, heart rate changes  Review of Systems     Objective:   Physical Exam alert and in no distress. EOMI. Other cranial nerves grossly intact. Cerebellar testing did show some instability but in no particular direction Tympanic membranes and canals are normal. Throat is clear. Tonsils are normal. Neck is supple without adenopathy or thyromegaly. Cardiac exam shows a regular sinus rhythm without murmurs or gallops. Lungs are clear to auscultation.       Assessment & Plan:   1. Vertigo    I will give her meclizine but cautioned her to use this very sparingly because of sedation. She will keep in touch with me.

## 2011-06-10 ENCOUNTER — Telehealth: Payer: Self-pay | Admitting: Internal Medicine

## 2011-06-10 NOTE — Telephone Encounter (Signed)
I discussed this with her. She is to add Allegra to her regimen if no better come in tomorrow for possible ENT referral

## 2011-06-11 ENCOUNTER — Telehealth: Payer: Self-pay | Admitting: Family Medicine

## 2011-06-11 NOTE — Telephone Encounter (Signed)
She has had some difficulty with intermittent dizziness. This seems to have cleared up at present time but she does describe word searching. She will make an appointment for further evaluation of this.

## 2011-06-14 ENCOUNTER — Ambulatory Visit (INDEPENDENT_AMBULATORY_CARE_PROVIDER_SITE_OTHER): Payer: MEDICARE | Admitting: Family Medicine

## 2011-06-14 ENCOUNTER — Encounter: Payer: Self-pay | Admitting: Family Medicine

## 2011-06-14 ENCOUNTER — Other Ambulatory Visit: Payer: Self-pay

## 2011-06-14 VITALS — BP 124/70 | HR 60 | Wt 134.0 lb

## 2011-06-14 DIAGNOSIS — L309 Dermatitis, unspecified: Secondary | ICD-10-CM

## 2011-06-14 DIAGNOSIS — R42 Dizziness and giddiness: Secondary | ICD-10-CM

## 2011-06-14 DIAGNOSIS — L259 Unspecified contact dermatitis, unspecified cause: Secondary | ICD-10-CM

## 2011-06-14 MED ORDER — TACROLIMUS 0.1 % EX OINT: TOPICAL_OINTMENT | Freq: Two times a day (BID) | CUTANEOUS | Status: DC

## 2011-06-14 NOTE — Patient Instructions (Signed)
Call if any further problems

## 2011-06-14 NOTE — Progress Notes (Signed)
  Subjective:    Patient ID: [name], female    DOB: 09-03-1924, 78 y.o.   MRN: [id number]  HPI She is here for reevaluation of dizziness. She did have occult he was this last weekend has been using meclizine. Today she is feeling better. She cannot relate the dizziness to head position area she's had no numbness, tingling, weakness, blurred or double vision. She would also like her protopic prescription renewed. She did see Dr. Terri Piedra for this and would like me to refill it for her. She mainly has trouble around her lips.   Review of Systems     Objective:   Physical Exam alert and in no distress. EOMI. DTR's wnl.Tympanic membranes and canals are normal. Throat is clear. Tonsils are normal. Neck is supple without adenopathy or thyromegaly. Cardiac exam shows a regular sinus rhythm without murmurs or gallops. Lungs are clear to auscultation. Exam shows no changes       Assessment & Plan:   1. Dermatitis  tacrolimus (PROTOPIC) 0.1 % ointment  2. Vertigo     I explained to her and her son that her dizziness symptoms were more likely related to or ear issues. There was no indicators of problems in any other area. They're comfortable with this. She will continue to use the meclizine as needed

## 2011-07-01 ENCOUNTER — Encounter: Payer: Self-pay | Admitting: *Deleted

## 2011-11-11 ENCOUNTER — Ambulatory Visit (INDEPENDENT_AMBULATORY_CARE_PROVIDER_SITE_OTHER): Payer: MEDICARE | Admitting: Family Medicine

## 2011-11-11 ENCOUNTER — Encounter: Payer: Self-pay | Admitting: Family Medicine

## 2011-11-11 VITALS — BP 102/70 | HR 60 | Wt 131.0 lb

## 2011-11-11 DIAGNOSIS — H918X9 Other specified hearing loss, unspecified ear: Secondary | ICD-10-CM

## 2011-11-11 DIAGNOSIS — R5383 Other fatigue: Secondary | ICD-10-CM

## 2011-11-11 DIAGNOSIS — H6120 Impacted cerumen, unspecified ear: Secondary | ICD-10-CM

## 2011-11-11 DIAGNOSIS — R5381 Other malaise: Secondary | ICD-10-CM

## 2011-11-11 DIAGNOSIS — R413 Other amnesia: Secondary | ICD-10-CM

## 2011-11-11 LAB — CBC WITH DIFFERENTIAL/PLATELET
Eosinophils Relative: 6 % — ABNORMAL HIGH (ref 0–5)
HCT: 40.8 % (ref 36.0–46.0)
Hemoglobin: 13.8 g/dL (ref 12.0–15.0)
Lymphocytes Relative: 26 % (ref 12–46)
Lymphs Abs: 1.8 10*3/uL (ref 0.7–4.0)
MCV: 92.5 fL (ref 78.0–100.0)
Monocytes Absolute: 0.7 10*3/uL (ref 0.1–1.0)
RBC: 4.41 MIL/uL (ref 3.87–5.11)
WBC: 7.1 10*3/uL (ref 4.0–10.5)

## 2011-11-11 LAB — COMPREHENSIVE METABOLIC PANEL
ALT: 13 U/L (ref 0–35)
Albumin: 4 g/dL (ref 3.5–5.2)
BUN: 11 mg/dL (ref 6–23)
CO2: 30 mEq/L (ref 19–32)
Calcium: 9.2 mg/dL (ref 8.4–10.5)
Chloride: 100 mEq/L (ref 96–112)
Creat: 0.53 mg/dL (ref 0.50–1.10)

## 2011-11-11 LAB — TSH: TSH: 1.146 u[IU]/mL (ref 0.350–4.500)

## 2011-11-11 NOTE — Progress Notes (Signed)
  Subjective:    Patient ID: [name], female    DOB: 12/03/1924, 78 y.o.   MRN: [id number]  HPI  She is here for consult concerning multiple issues. She is having difficulty with bilateral hearing loss and thinks she has cerumen impaction. She would like this cleaned out. She also complains of difficulty with decreased energy as well as difficulty finding certain words. Her relatives of also noted this. She's had no skin or hair changes, constipation. No headache, blurred vision. She continues on medications listed in the chart.  Review of Systems     Objective:   Physical Exam alert and in no distress. Tympanic membranes and canals are normal after cerumen was removed from both canals.. Throat is clear. Tonsils are normal. Neck is supple without adenopathy or thyromegaly. Cardiac exam shows a regular sinus rhythm without murmurs or gallops. Lungs are clear to auscultation.  [id number]      Assessment & Plan:   1. Fatigue  CBC with Differential, Comprehensive metabolic panel, TSH  2. Memory impairment  CBC with Differential, Comprehensive metabolic panel, TSH  3. Hearing loss secondary to cerumen impaction  Ear cerumen removal    I discussed the fact that her MMSE is normal. Did state that in the future medication might possibly be useful. I will do routine screening including thyroid function.

## 2011-11-12 NOTE — Progress Notes (Signed)
Called pt home # cell # Pt informed labs normal

## 2012-02-15 ENCOUNTER — Ambulatory Visit (INDEPENDENT_AMBULATORY_CARE_PROVIDER_SITE_OTHER): Payer: MEDICARE | Admitting: Family Medicine

## 2012-02-15 ENCOUNTER — Encounter: Payer: Self-pay | Admitting: Family Medicine

## 2012-02-15 VITALS — BP 110/70 | HR 68 | Wt 131.0 lb

## 2012-02-15 DIAGNOSIS — R413 Other amnesia: Secondary | ICD-10-CM

## 2012-02-15 DIAGNOSIS — M542 Cervicalgia: Secondary | ICD-10-CM

## 2012-02-15 NOTE — Progress Notes (Signed)
  Subjective:    Patient ID: [name], female    DOB: July 21, 1924, 78 y.o.   MRN: [id number]  HPI She has a nine-month history of right-sided lower neck pain. The pain now radiates up to the top of her head and is intermittent in nature. It can last up to an hour. She's had no blurred or double vision, weakness or slurred speech. Aleve does help. She and her son have also noted difficulty with memory. This usually has to do with recent memory.   Review of Systems     Objective:   Physical Exam alert and in no distress. EOMI. No carotid bruits noted. DTRs normal. Tympanic membranes and canals are normal. Throat is clear. Tonsils are normal. Neck is supple without adenopathy or thyromegaly. Cardiac exam shows a regular sinus rhythm without murmurs or gallops. Lungs are clear to auscultation.  MMSE 29      Assessment & Plan:   1. Neck pain on right side   2. Memory loss    recommending conservative care for the neck pain. Explained that her symptoms and exam did not point towards any particular major problem. Recommend Aleve. Also discussed the memory loss. I discussed medications but at this time do not feel it is appropriate.

## 2012-02-18 ENCOUNTER — Other Ambulatory Visit: Payer: Self-pay | Admitting: Family Medicine

## 2012-02-18 NOTE — Telephone Encounter (Signed)
IS THIS OK 

## 2012-04-27 ENCOUNTER — Other Ambulatory Visit: Payer: Self-pay | Admitting: Family Medicine

## 2012-04-27 DIAGNOSIS — R413 Other amnesia: Secondary | ICD-10-CM

## 2012-04-28 ENCOUNTER — Ambulatory Visit
Admission: RE | Admit: 2012-04-28 | Discharge: 2012-04-28 | Disposition: A | Discharge status: Home / Self Care | Payer: MEDICARE | Source: Ambulatory Visit | Attending: Family Medicine | Admitting: Family Medicine

## 2012-04-28 ENCOUNTER — Other Ambulatory Visit: Payer: Self-pay

## 2012-04-28 DIAGNOSIS — R413 Other amnesia: Secondary | ICD-10-CM

## 2012-04-28 MED ORDER — METOPROLOL SUCCINATE ER 25 MG PO TB24: 25.0000 mg | ORAL_TABLET | Freq: Every day | ORAL | Status: DC

## 2012-08-31 DIAGNOSIS — Z0289 Encounter for other administrative examinations: Secondary | ICD-10-CM

## 2012-09-08 ENCOUNTER — Telehealth: Payer: Self-pay | Admitting: Internal Medicine

## 2012-09-08 NOTE — Telephone Encounter (Signed)
[facility]. Laural Benes @ [phone]

## 2012-09-18 ENCOUNTER — Ambulatory Visit: Payer: MEDICARE | Attending: Family Medicine | Admitting: Internal Medicine

## 2012-09-18 VITALS — BP 144/75 | HR 78 | Temp 98.0°F | Resp 17 | Wt 126.4 lb

## 2012-09-18 DIAGNOSIS — F0390 Unspecified dementia without behavioral disturbance: Secondary | ICD-10-CM | POA: Insufficient documentation

## 2012-09-18 LAB — CBC WITH DIFFERENTIAL/PLATELET
Basophils Absolute: 0 10*3/uL (ref 0.0–0.1)
Basophils Relative: 0 % (ref 0–1)
Eosinophils Relative: 3 % (ref 0–5)
HCT: 43.1 % (ref 36.0–46.0)
Lymphocytes Relative: 14 % (ref 12–46)
MCHC: 33.9 g/dL (ref 30.0–36.0)
MCV: 91.9 fL (ref 78.0–100.0)
Monocytes Absolute: 0.7 10*3/uL (ref 0.1–1.0)
Neutro Abs: 6.3 10*3/uL (ref 1.7–7.7)
Platelets: 266 10*3/uL (ref 150–400)
RDW: 14 % (ref 11.5–15.5)
WBC: 8.4 10*3/uL (ref 4.0–10.5)

## 2012-09-18 LAB — LIPID PANEL
HDL: 73 mg/dL (ref >39)
LDL Cholesterol: 164 mg/dL — ABNORMAL HIGH (ref 0–99)
Total CHOL/HDL Ratio: 3.6 Ratio
Triglycerides: 113 mg/dL (ref <150)

## 2012-09-18 LAB — COMPREHENSIVE METABOLIC PANEL
AST: 20 U/L (ref 0–37)
Alkaline Phosphatase: 70 U/L (ref 39–117)
BUN: 14 mg/dL (ref 6–23)
Creat: 0.7 mg/dL (ref 0.50–1.10)
Glucose, Bld: 90 mg/dL (ref 70–99)
Total Bilirubin: 0.5 mg/dL (ref 0.3–1.2)

## 2012-09-18 NOTE — Progress Notes (Signed)
Patient here to establish care  

## 2012-09-18 NOTE — Patient Instructions (Signed)
Dementia Dementia is a general term for problems with brain function. A person with dementia has memory loss and a hard time with at least one other brain function such as thinking, speaking, or problem solving. Dementia can affect social functioning, how you do your job, your mood, or your personality. The changes may be hidden for a long time. The earliest forms of this disease are usually not detected by family or friends. Dementia can be:  Irreversible.  Potentially reversible.  Partially reversible.  Progressive. This means it can get worse over time. CAUSES  Irreversible dementia causes may include:  Degeneration of brain cells (Alzheimer's disease or lewy body dementia).  Multiple small strokes (vascular dementia).  Infection (chronic meningitis or Creutzfelt-Jakob disease).  Frontotemporal dementia. This affects younger people, age 40 to 70, compared to those who have Alzheimer's disease.  Dementia associated with other disorders like Parkinson's disease, Huntington's disease, or HIV-associated dementia. Potentially or partially reversible dementia causes may include:  Medicines.  Metabolic causes such as excessive alcohol intake, vitamin B12 deficiency, or thyroid disease.  Masses or pressure in the brain such as a tumor, blood clot, or hydrocephalus. SYMPTOMS  Symptoms are often hard to detect. Family members or coworkers may not notice them early in the disease process. Different people with dementia may have different symptoms. Symptoms can include:  A hard time with memory, especially recent memory. Long-term memory may not be impaired.  Asking the same question multiple times or forgetting something someone just said.  A hard time speaking your thoughts or finding certain words.  A hard time solving problems or performing familiar tasks (such as how to use a telephone).  Sudden changes in mood.  Changes in personality, especially increasing moodiness or  mistrust.  Depression.  A hard time understanding complex ideas that were never a problem in the past. DIAGNOSIS  There are no specific tests for dementia.   Your caregiver may recommend a thorough evaluation. This is because some forms of dementia can be reversible. The evaluation will likely include a physical exam and getting a detailed history from you and a family member. The history often gives the best clues and suggestions for a diagnosis.  Memory testing may be done. A detailed brain function evaluation called neuropsychologic testing may be helpful.  Lab tests and brain imaging (such as a CT scan or MRI scan) are sometimes important.  Sometimes observation and re-evaluation over time is very helpful. TREATMENT  Treatment depends on the cause.   If the problem is a vitamin deficiency, it may be helped or cured with supplements.  For dementias such as Alzheimer's disease, medicines are available to stabilize or slow the course of the disease. There are no cures for this type of dementia.  Your caregiver can help direct you to groups, organizations, and other caregivers to help with decisions in the care of you or your loved one. HOME CARE INSTRUCTIONS The care of individuals with dementia is varied and dependent upon the progression of the dementia. The following suggestions are intended for the person living with, or caring for, the person with dementia.  Create a safe environment.  Remove the locks on bathroom doors to prevent the person from accidentally locking himself or herself in.  Use childproof latches on kitchen cabinets and any place where cleaning supplies, chemicals, or alcohol are kept.  Use childproof covers in unused electrical outlets.  Install childproof devices to keep doors and windows secured.  Remove stove knobs or install safety   knobs and an automatic shut-off on the stove.  Lower the temperature on water heaters.  Label medicines and keep them  locked up.  Secure knives, lighters, matches, power tools, and guns, and keep these items out of reach.  Keep the house free from clutter. Remove rugs or anything that might contribute to a fall.  Remove objects that might break and hurt the person.  Make sure lighting is good, both inside and outside.  Install grab rails as needed.  Use a monitoring device to alert you to falls or other needs for help.  Reduce confusion.  Keep familiar objects and people around.  Use night lights or dim lights at night.  Label items or areas.  Use reminders, notes, or directions for daily activities or tasks.  Keep a simple, consistent routine for waking, meals, bathing, dressing, and bedtime.  Create a calm, quiet environment.  Place large clocks and calendars prominently.  Display emergency numbers and home address near all telephones.  Use cues to establish different times of the day. An example is to open curtains to let the natural light in during the day.   Use effective communication.  Choose simple words and short sentences.  Use a gentle, calm tone of voice.  Be careful not to interrupt.  If the person is struggling to find a word or communicate a thought, try to provide the word or thought.  Ask one question at a time. Allow the person ample time to answer questions. Repeat the question again if the person does not respond.  Reduce nighttime restlessness.  Provide a comfortable bed.  Have a consistent nighttime routine.  Ensure a regular walking or physical activity schedule. Involve the person in daily activities as much as possible.  Limit napping during the day.  Limit caffeine.  Attend social events that stimulate rather than overwhelm the senses.  Encourage good nutrition and hydration.  Reduce distractions during meal times and snacks.  Avoid foods that are too hot or too cold.  Monitor chewing and swallowing ability.  Continue with routine vision,  hearing, dental, and medical screenings.  Only give over-the-counter or prescription medicines as directed by the caregiver.  Monitor driving abilities. Do not allow the person to drive when safe driving is no longer possible.  Register with an identification program which could provide location assistance in the event of a missing person situation. SEEK MEDICAL CARE IF:   New behavioral problems start such as moodiness, aggressiveness, or seeing things that are not there (hallucinations).  Any new problem with brain function happens. This includes problems with balance, speech, or falling a lot.  Problems with swallowing develop.  Any symptoms of other illness happen. Small changes or worsening in any aspect of brain function can be a sign that the illness is getting worse. It can also be a sign of another medical illness such as infection. Seeing a caregiver right away is important. SEEK IMMEDIATE MEDICAL CARE IF:   A fever develops.  New or worsened confusion develops.  New or worsened sleepiness develops.  Staying awake becomes hard to do. Document Released: 09/22/2000 Document Revised: 06/21/2011 Document Reviewed: 08/24/2010 Exit[employer] Patient Information 2014 [employer].  

## 2012-09-18 NOTE — Progress Notes (Signed)
Patient ID: [name], female   DOB: 12-Jan-1925, 79 y.o.   MRN: [id number]   CC: memory problem   HPI:  The patient in 79 year old female who presents to clinic with main concern of difficulty with memory. She is here with her son and explains that she is having difficulty recalling certain things, forgets what she was planning to do, forgets to do things that she had assigned to herself to do. This has been going on for several years now but progressively worse over the past several months. Patient denies chest pain or shortness of breath, no specific abdominal or urinary concerns, no changes in appetite, no changes in weight. Patient denies any recent sicknesses or hospitalizations. Patient also tells me she's not taking any medicine. Patient explains she is trying to exercise daily by walking and is watching what she is eating. She checks her blood pressure regularly and tells me it has been very well controlled.  No Known Allergies Past Medical History  Diagnosis Date  . Dyslipidemia   . Alopecia   . Arthritis   . Hereditary peripheral neuropathy(356.0)     neurology eval 3/12  . Hereditary neuropathy with pressure palsies (HNPP)   . PVC (premature ventricular contraction)     history  . Vertigo history  . Neuropathy     history  . Hyperlipidemia   . Hx of mitral valve prolapse     mild prolapse with mitral regurgitation   Current Outpatient Prescriptions on File Prior to Visit  Medication Sig Dispense Refill  . aspirin 325 MG tablet Take 325 mg by mouth daily.      . meclizine (ANTIVERT) 12.5 MG tablet TAKE ONE TABLET TWICE DAILY AS NEEDED  30 tablet  0  . metoprolol succinate (TOPROL-XL) 25 MG 24 hr tablet Take 1 tablet (25 mg total) by mouth daily.  60 tablet  5  . Multiple Vitamins-Minerals (OCUVITE EYE HEALTH FORMULA) CAPS Take 2 capsules by mouth 2 (two) times daily.         No current facility-administered medications on file prior to visit.   Family History   Problem Relation Age of Onset  . Other Father     heart valve infection  . Stroke Mother    History   Social History  . Marital Status: Widowed    Spouse Name: N/A    Number of Children: N/A  . Years of Education: N/A   Occupational History  . Not on file.   Social History Main Topics  . Smoking status: Former Smoker -- 1.00 packs/day    Quit date: 07/01/1963  . Smokeless tobacco: Not on file  . Alcohol Use: No  . Drug Use: Not on file  . Sexually Active:    Other Topics Concern  . Not on file   Social History Narrative  . No narrative on file    Review of Systems  Constitutional: Negative for fever, chills, diaphoresis, activity change, appetite change and fatigue.  HENT: Negative for ear pain, nosebleeds, congestion, facial swelling, rhinorrhea, neck pain, neck stiffness and ear discharge.   Eyes: Negative for pain, discharge, redness, itching and visual disturbance.  Respiratory: Negative for cough, choking, chest tightness, shortness of breath, wheezing and stridor.   Cardiovascular: Negative for chest pain, palpitations and leg swelling.  Gastrointestinal: Negative for abdominal distention.  Genitourinary: Negative for dysuria, urgency, frequency, hematuria, flank pain, decreased urine volume, difficulty urinating and dyspareunia.  Musculoskeletal: Negative for back pain, joint swelling, arthralgias and gait problem.  Neurological: Negative for dizziness, tremors, seizures, syncope, facial asymmetry, speech difficulty, weakness, light-headedness, numbness and headaches.  Hematological: Negative for adenopathy. Does not bruise/bleed easily.  Psychiatric/Behavioral: Negative for hallucinations, behavioral problems, confusion, dysphoric mood, decreased concentration and agitation.    Objective:   Filed Vitals:   09/18/12 0914  BP: 144/75  Pulse: 78  Temp: 98 F (36.7 C)  Resp: 17    Physical Exam  Constitutional: Appears well-developed and well-nourished.  No distress.  HENT: Normocephalic. External right and left ear normal. Oropharynx is clear and moist.  Eyes: Conjunctivae and EOM are normal. PERRLA, no scleral icterus.  Neck: Normal ROM. Neck supple. No JVD. No tracheal deviation. No thyromegaly.  CVS: RRR, S1/S2 +, no murmurs, no gallops, no carotid bruit.  Pulmonary: Effort and breath sounds normal, no stridor, rhonchi, wheezes, rales.  Abdominal: Soft. BS +,  no distension, tenderness, rebound or guarding.  Musculoskeletal: Normal range of motion. No edema and no tenderness.  Lymphadenopathy: No lymphadenopathy noted, cervical, inguinal. Neuro: Alert. Normal reflexes, muscle tone coordination. No cranial nerve deficit. Mini-Mental Status exam patient scored 26/30 and basically missed recalling objects after 5 minutes Skin: Skin is warm and dry. No rash noted. Not diaphoretic. No erythema. No pallor.  Psychiatric: Normal mood and affect. Behavior, judgment, thought content normal.   Lab Results  Component Value Date   WBC 7.1 11/11/2011   HGB 13.8 11/11/2011   HCT 40.8 11/11/2011   MCV 92.5 11/11/2011   PLT 282 11/11/2011   Lab Results  Component Value Date   CREATININE 0.53 11/11/2011   BUN 11 11/11/2011   NA 136 11/11/2011   K 4.4 11/11/2011   CL 100 11/11/2011   CO2 30 11/11/2011    No results found for this basename: HGBA1C   Lipid Panel  No results found for this basename: chol, trig, hdl, cholhdl, vldl, ldlcalc       Assessment and plan:   Dementia - Based on my physical exam in Mini-Mental Status, possible early dementia exists but overall patient is relatively healthy - Patient looks much younger than the stated age - I will check TSH, B12, electrolyte panel today - We have discussed medicine such as Namenda which patient was taken in the past but she's not currently open to the idea of taking medicine - neurology consultation requested by pt's son

## 2012-10-20 ENCOUNTER — Encounter: Payer: Self-pay | Admitting: Diagnostic Neuroimaging

## 2012-10-20 ENCOUNTER — Ambulatory Visit (INDEPENDENT_AMBULATORY_CARE_PROVIDER_SITE_OTHER): Payer: Medicare Other | Admitting: Diagnostic Neuroimaging

## 2012-10-20 VITALS — BP 141/84 | HR 72 | Temp 98.0°F | Ht 62.0 in | Wt 133.5 lb

## 2012-10-20 DIAGNOSIS — F0390 Unspecified dementia without behavioral disturbance: Secondary | ICD-10-CM

## 2012-10-20 MED ORDER — DONEPEZIL HCL 10 MG PO TABS: 10.0000 mg | ORAL_TABLET | Freq: Every day | ORAL | Status: AC

## 2012-10-20 MED ORDER — SERTRALINE HCL 25 MG PO TABS: 25.0000 mg | ORAL_TABLET | Freq: Every day | ORAL | Status: DC

## 2012-10-20 MED ORDER — DONEPEZIL HCL 5 MG PO TABS: 5.0000 mg | ORAL_TABLET | Freq: Every day | ORAL | Status: DC

## 2012-10-20 NOTE — Patient Instructions (Addendum)
Start donepezil 5mg  at bedtime x 4 weeks, then increase to 10mg  at bedtime.  Start sertraline 25mg  daily.
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
## 2012-10-23 ENCOUNTER — Telehealth: Payer: Self-pay | Admitting: Diagnostic Neuroimaging

## 2012-10-23 NOTE — Telephone Encounter (Signed)
Pts son called and pt is having dizziness with the zoloft.   She took on Saturday, relayed to son that she was having dizziness and son told her to stop the medication today and she is better.  Continues with the aricept. She lives at [facility], uses [facility].

## 2012-10-24 ENCOUNTER — Encounter: Payer: Self-pay | Admitting: Family Medicine

## 2012-10-24 DIAGNOSIS — R413 Other amnesia: Secondary | ICD-10-CM | POA: Insufficient documentation

## 2012-10-24 DIAGNOSIS — G629 Polyneuropathy, unspecified: Secondary | ICD-10-CM | POA: Insufficient documentation

## 2012-10-24 DIAGNOSIS — Z0000 Encounter for general adult medical examination without abnormal findings: Secondary | ICD-10-CM | POA: Insufficient documentation

## 2012-11-03 ENCOUNTER — Telehealth: Payer: Self-pay | Admitting: *Deleted

## 2012-11-03 NOTE — Telephone Encounter (Signed)
Message copied by Hermenia Fiscal on Fri Nov 03, 2012 11:01 AM ------      Message from: Seth Bake      Created: Thu Nov 02, 2012  2:47 PM      Contact: Son       Carmin Richmond, patients son, called last week regarding dizziness caused by the Zoloft prescribed.  She discontinued use of the Zoloft.  What could she take?  Would like a return call today please.  ------

## 2012-11-03 NOTE — Telephone Encounter (Signed)
Dr. Marjory Lies out today.   He does have message about zoloft.

## 2012-11-06 ENCOUNTER — Telehealth: Payer: Self-pay | Admitting: Family Medicine

## 2012-11-06 NOTE — Telephone Encounter (Signed)
Calling to know whether to come in and be seen for constipation.

## 2012-11-07 ENCOUNTER — Telehealth: Payer: Self-pay | Admitting: *Deleted

## 2012-11-07 NOTE — Telephone Encounter (Signed)
11/07/12 Patient states that she already have some medication . P.Mayford Knife, RN BSN [facility]

## 2012-11-14 NOTE — Telephone Encounter (Signed)
I called and spoke with pt.  She stopped the zoloft and dizziness went away.  I told her that she needed to f/u with pcp or psychiatry re: depression.  She stated that she does not have a pcp.  She has reminded her son, Nida Boatman about getting her one.  It seems that no one wants to see Medicare pts.   I tried his cell [phone] this was temporarily disconnected and then home #.  He answered and I relayed the above information.  Recommended several pcp ([facility], [facility], [facility]).  He verbalized understanding.

## 2012-11-14 NOTE — Telephone Encounter (Signed)
Ok to stay off zoloft. F/u with PCP or psychiatry re: depression. -VRP

## 2012-12-27 ENCOUNTER — Telehealth: Payer: Self-pay | Admitting: Family Medicine

## 2013-01-10 ENCOUNTER — Ambulatory Visit: Payer: MEDICARE

## 2013-02-07 ENCOUNTER — Encounter (HOSPITAL_COMMUNITY): Payer: Self-pay | Admitting: Emergency Medicine

## 2013-02-07 ENCOUNTER — Emergency Department (HOSPITAL_COMMUNITY)
Admission: EM | Admit: 2013-02-07 | Discharge: 2013-02-07 | Disposition: A | Discharge status: Home / Self Care | Payer: MEDICARE | Attending: Emergency Medicine | Admitting: Emergency Medicine

## 2013-02-07 ENCOUNTER — Emergency Department (HOSPITAL_COMMUNITY): Payer: MEDICARE

## 2013-02-07 DIAGNOSIS — F0390 Unspecified dementia without behavioral disturbance: Secondary | ICD-10-CM | POA: Insufficient documentation

## 2013-02-07 DIAGNOSIS — G608 Other hereditary and idiopathic neuropathies: Secondary | ICD-10-CM | POA: Insufficient documentation

## 2013-02-07 DIAGNOSIS — Z87891 Personal history of nicotine dependence: Secondary | ICD-10-CM | POA: Insufficient documentation

## 2013-02-07 DIAGNOSIS — I4949 Other premature depolarization: Secondary | ICD-10-CM | POA: Insufficient documentation

## 2013-02-07 DIAGNOSIS — Z872 Personal history of diseases of the skin and subcutaneous tissue: Secondary | ICD-10-CM | POA: Insufficient documentation

## 2013-02-07 DIAGNOSIS — M129 Arthropathy, unspecified: Secondary | ICD-10-CM | POA: Insufficient documentation

## 2013-02-07 DIAGNOSIS — R413 Other amnesia: Secondary | ICD-10-CM

## 2013-02-07 DIAGNOSIS — I059 Rheumatic mitral valve disease, unspecified: Secondary | ICD-10-CM | POA: Insufficient documentation

## 2013-02-07 DIAGNOSIS — E871 Hypo-osmolality and hyponatremia: Secondary | ICD-10-CM | POA: Insufficient documentation

## 2013-02-07 DIAGNOSIS — Z7982 Long term (current) use of aspirin: Secondary | ICD-10-CM | POA: Insufficient documentation

## 2013-02-07 DIAGNOSIS — Z79899 Other long term (current) drug therapy: Secondary | ICD-10-CM | POA: Insufficient documentation

## 2013-02-07 LAB — COMPREHENSIVE METABOLIC PANEL
ALT: 16 U/L (ref 0–35)
AST: 24 U/L (ref 0–37)
Albumin: 3.7 g/dL (ref 3.5–5.2)
Alkaline Phosphatase: 71 U/L (ref 39–117)
BUN: 9 mg/dL (ref 6–23)
Chloride: 95 mEq/L — ABNORMAL LOW (ref 96–112)
GFR calc Af Amer: >90 mL/min (ref >90)
Glucose, Bld: 102 mg/dL — ABNORMAL HIGH (ref 70–99)
Potassium: 4.3 mEq/L (ref 3.5–5.1)
Sodium: 131 mEq/L — ABNORMAL LOW (ref 135–145)
Total Bilirubin: 0.5 mg/dL (ref 0.3–1.2)
Total Protein: 6.6 g/dL (ref 6.0–8.3)

## 2013-02-07 LAB — APTT: aPTT: 28 seconds (ref 24–37)

## 2013-02-07 LAB — DIFFERENTIAL
Basophils Relative: 0 % (ref 0–1)
Eosinophils Absolute: 0.1 10*3/uL (ref 0.0–0.7)
Lymphocytes Relative: 16 % (ref 12–46)
Lymphs Abs: 1.3 10*3/uL (ref 0.7–4.0)
Monocytes Relative: 11 % (ref 3–12)
Neutro Abs: 5.8 10*3/uL (ref 1.7–7.7)
Neutrophils Relative %: 72 % (ref 43–77)

## 2013-02-07 LAB — URINALYSIS, ROUTINE W REFLEX MICROSCOPIC
Bilirubin Urine: NEGATIVE
Hgb urine dipstick: NEGATIVE
Ketones, ur: NEGATIVE mg/dL
Specific Gravity, Urine: 1.013 (ref 1.005–1.030)
Urobilinogen, UA: 0.2 mg/dL (ref 0.0–1.0)
pH: 7 (ref 5.0–8.0)

## 2013-02-07 LAB — PROTIME-INR
INR: 1.04 (ref 0.00–1.49)
Prothrombin Time: 13.4 seconds (ref 11.6–15.2)

## 2013-02-07 LAB — CBC
HCT: 40.4 % (ref 36.0–46.0)
Hemoglobin: 14 g/dL (ref 12.0–15.0)
MCH: 32.3 pg (ref 26.0–34.0)
MCHC: 34.7 g/dL (ref 30.0–36.0)
Platelets: 269 10*3/uL (ref 150–400)
RBC: 4.33 MIL/uL (ref 3.87–5.11)
WBC: 8.1 10*3/uL (ref 4.0–10.5)

## 2013-02-07 NOTE — ED Provider Notes (Signed)
CSN: [id number]     Arrival date & time 02/07/13  1159 History   First MD Initiated Contact with Patient 02/07/13 1216     Chief Complaint  Patient presents with  . Altered Mental Status    HPI Pt states she was feeling sick a couple of days ago.  The symptoms resolved and she was able to vote this morning.  She was back at her living facility today and she felt that something was wrong.  Pt states she just didn't feel right.  She cannot tell me what specifically occurred.  She just feels that something is wrong with her head.  She denies trouble with her memory.  She states her speech is affected and having trouble finding some words.  No trouble with balance or coordination.  Pt is not sure if she has a doctor or has seen someone one. Past Medical History  Diagnosis Date  . Dyslipidemia   . Alopecia   . Arthritis   . Hereditary peripheral neuropathy(356.0)     neurology eval 3/12  . Hereditary neuropathy with pressure palsies (HNPP)   . PVC (premature ventricular contraction)     history  . Vertigo history  . Neuropathy     history  . Hyperlipidemia   . Hx of mitral valve prolapse     mild prolapse with mitral regurgitation   Past Surgical History  Procedure Laterality Date  . Dilation and curettage of uterus    . Tonsillectomy    . Bunionectomy     Family History  Problem Relation Age of Onset  . Other Father     heart valve infection  . Stroke Mother   . Diabetes Mother    History  Substance Use Topics  . Smoking status: Former Smoker -- 1.00 packs/day for 8 years    Quit date: 07/01/1963  . Smokeless tobacco: Not on file  . Alcohol Use: No   OB History   Grav Para Term Preterm Abortions TAB SAB Ect Mult Living                 Review of Systems  Allergies  Review of patient's allergies indicates no known allergies.  Home Medications   Current Outpatient Rx  Name  Route  Sig  Dispense  Refill  . donepezil (ARICEPT) 10 MG tablet   Oral   Take 1 tablet  (10 mg total) by mouth at bedtime.   30 tablet   12   . metoprolol succinate (TOPROL-XL) 25 MG 24 hr tablet   Oral   Take 1 tablet (25 mg total) by mouth daily.   60 tablet   5   . Multiple Vitamins-Minerals (OCUVITE EYE HEALTH FORMULA) CAPS   Oral   Take 2 capsules by mouth 2 (two) times daily.           . Ascorbic Acid (VITAMIN C) 1000 MG tablet   Oral   Take 1,000 mg by mouth daily.         Marland Kitchen aspirin 325 MG tablet   Oral   Take 325 mg by mouth daily.         . Biotin 5000 MCG CAPS   Oral   Take 5,000 mcg by mouth daily.         . Calcium Carbonate-Vit D-Min (CALCIUM 1200 PO)   Oral   Take by mouth.         . cholecalciferol (VITAMIN D) 1000 UNITS tablet   Oral   Take  1,000 Units by mouth daily.         . COD LIVER OIL PO   Oral   Take by mouth.         . Coenzyme Q10 (CO Q-10) 50 MG CAPS   Oral   Take by mouth.         . fish oil-omega-3 fatty acids 1000 MG capsule   Oral   Take 1,200 mg by mouth daily.         . folic acid (FOLVITE) 800 MCG tablet   Oral   Take 400 mcg by mouth daily.         . Garlic 1000 MG CAPS   Oral   Take by mouth.         . Glucosamine-Chondroit-Vit C-Mn (GLUCOSAMINE 1500 COMPLEX) CAPS   Oral   Take by mouth.         . meclizine (ANTIVERT) 12.5 MG tablet      TAKE ONE TABLET TWICE DAILY AS NEEDED   30 tablet   0   . Multiple Vitamin (MULTIVITAMIN WITH MINERALS) TABS tablet   Oral   Take 1 tablet by mouth daily.         . vitamin E 100 UNIT capsule   Oral   Take 100 Units by mouth daily.          BP 150/64  Pulse 100  Temp(Src) 98.1 F (36.7 C) (Oral)  Resp 16  SpO2 94% Physical Exam  Nursing note and vitals reviewed. Constitutional: She is oriented to person, place, and time. She appears well-developed and well-nourished. No distress.  The patient is sitting comfortably reading a newspaper  HENT:  Head: Normocephalic and atraumatic.  Right Ear: External ear normal.  Left Ear:  External ear normal.  Mouth/Throat: Oropharynx is clear and moist.  Eyes: Conjunctivae are normal. Right eye exhibits no discharge. Left eye exhibits no discharge. No scleral icterus.  Neck: Neck supple. No tracheal deviation present.  Cardiovascular: Normal rate, regular rhythm and intact distal pulses.   Pulmonary/Chest: Effort normal and breath sounds normal. No stridor. No respiratory distress. She has no wheezes. She has no rales.  Abdominal: Soft. Bowel sounds are normal. She exhibits no distension. There is no tenderness. There is no rebound and no guarding.  Musculoskeletal: She exhibits no edema and no tenderness.  Chronic deformity right ankle  Neurological: She is alert and oriented to person, place, and time. She has normal strength. No sensory deficit. Cranial nerve deficit:  no gross defecits noted. She exhibits normal muscle tone. She displays no seizure activity. Coordination normal.  No pronator drift bilateral upper extrem, able to hold both legs off bed for 5 seconds, sensation intact in all extremities, no visual field cuts, no left or right sided neglect; pt oriented to person and place, "2 months left in the year, the year is 14", no slurred speech or aphasia  Skin: Skin is warm and dry. No rash noted.  Psychiatric: She has a normal mood and affect.    ED Course  Procedures (including critical care time) Labs Review Labs Reviewed  COMPREHENSIVE METABOLIC PANEL - Abnormal; Notable for the following:    Sodium 131 (*)    Chloride 95 (*)    Glucose, Bld 102 (*)    GFR calc non Af Amer 84 (*)    All other components within normal limits  URINALYSIS, ROUTINE W REFLEX MICROSCOPIC - Abnormal; Notable for the following:    APPearance CLOUDY (*)  Leukocytes, UA SMALL (*)    All other components within normal limits  PROTIME-INR  APTT  CBC  DIFFERENTIAL  TROPONIN I  URINE MICROSCOPIC-ADD ON  POCT I-STAT TROPONIN I   Imaging Review Ct Head Wo Contrast  02/07/2013    CLINICAL DATA:  Altered mental status.  EXAM: CT HEAD WITHOUT CONTRAST  TECHNIQUE: Contiguous axial images were obtained from the base of the skull through the vertex without intravenous contrast.  COMPARISON:  04/28/2012.  FINDINGS: No intracranial hemorrhage.  Prominent small vessel disease type changes. Remote infarct left caudate/lenticular nucleus.  No CT evidence of large acute infarct.  Global atrophy. Ventricular prominence unchanged. This may be related to atrophy although mild hydrocephalus not excluded.  No intracranial mass lesion noted on this unenhanced exam.  Visualized sinuses and mastoid air cells are clear.  IMPRESSION: No intracranial hemorrhage.  Prominent small vessel disease type changes. Remote infarct left caudate/lenticular nucleus.  No CT evidence of large acute infarct.  Global atrophy. Ventricular prominence unchanged. This may be related to atrophy although mild hydrocephalus not excluded.   Electronically Signed   By: Bridgett Larsson M.D.   On: 02/07/2013 13:38    EKG Interpretation     Ventricular Rate:  62 PR Interval:  212 QRS Duration: 92 QT Interval:  424 QTC Calculation: 431 R Axis:   -11 Text Interpretation:  Sinus rhythm Atrial premature complexes Borderline prolonged PR interval Borderline T abnormalities, diffuse leads            MDM   1. Memory loss   2. Mild dementia   3. Hyponatremia    I have reviewed the patient's old records. She has seen Dr. Marjory Lies.  Patient has been diagnosed with mild dementia. Patient was seen in July and they recommended followup in 4 months. I feel the patient's symptoms are most likely related to her dementia. I doubt stroke or TIA. She is in her exam here.  The patient does have mild hyponatremia. I doubt that this level would contribute to her symptoms. At this point, I recommend followup with her primary doctor and her neurologist. I discussed these findings with the patient.    Celene Kras, MD 02/07/13  [phone]

## 2013-02-07 NOTE — ED Notes (Addendum)
Pt is from [facility]. Pt states she has had difficulty communicating. The patient states that "she knows what she wants to say, but can't get it out." Pt reports that this has occurred for several weeks. Pt also reports that her "head doesn't feel right." Pt has a history of high blood pressure, asthma, and "neurological disease." Staff at facility reports a "change in the patient's behavior".

## 2013-02-19 ENCOUNTER — Ambulatory Visit (INDEPENDENT_AMBULATORY_CARE_PROVIDER_SITE_OTHER): Payer: MEDICARE | Admitting: Diagnostic Neuroimaging

## 2013-02-19 ENCOUNTER — Encounter: Payer: Self-pay | Admitting: Diagnostic Neuroimaging

## 2013-02-19 VITALS — BP 111/72 | HR 92 | Temp 97.5°F | Ht 62.0 in | Wt 124.0 lb

## 2013-02-19 DIAGNOSIS — F0391 Unspecified dementia with behavioral disturbance: Secondary | ICD-10-CM

## 2013-02-19 MED ORDER — MEMANTINE HCL ER 7 & 14 & 21 &28 MG PO CP24: 1.0000 | ORAL_CAPSULE | Freq: Every day | ORAL | Status: DC

## 2013-02-19 MED ORDER — MEMANTINE HCL ER 28 MG PO CP24: 1.0000 | ORAL_CAPSULE | Freq: Every day | ORAL | Status: AC

## 2013-02-19 NOTE — Patient Instructions (Signed)
Increase patient supervision, especially with medications.  Limit alcohol to 1 drink per day (e.g. 1.5 oz vodka).
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
## 2013-02-22 ENCOUNTER — Telehealth: Payer: Self-pay | Admitting: Diagnostic Neuroimaging

## 2013-02-22 NOTE — Telephone Encounter (Signed)
Daughter-in-law says pharmacy does not have Namenda. Med is on back order per manufacturer. Requesting to know if we could provide samples or alternate med if necessary.

## 2013-02-22 NOTE — Telephone Encounter (Signed)
Have them check with another pharmacy. Otherwise, they will need to wait until it is in stock. No alternate medication recommended. -VRP

## 2013-02-23 NOTE — Telephone Encounter (Signed)
Samples were given to patient per her request and shared message from Dr Marjory Lies, will pick up from front desk

## 2013-02-26 ENCOUNTER — Ambulatory Visit: Payer: MEDICARE | Admitting: Family Medicine

## 2013-03-14 ENCOUNTER — Ambulatory Visit: Payer: MEDICARE | Admitting: Podiatry

## 2013-03-28 ENCOUNTER — Encounter: Payer: Self-pay | Admitting: Podiatry

## 2013-03-28 ENCOUNTER — Ambulatory Visit (INDEPENDENT_AMBULATORY_CARE_PROVIDER_SITE_OTHER): Payer: MEDICARE | Admitting: Podiatry

## 2013-03-28 VITALS — BP 99/57 | HR 69 | Resp 16 | Ht 63.0 in | Wt 129.0 lb

## 2013-03-28 DIAGNOSIS — M205X9 Other deformities of toe(s) (acquired), unspecified foot: Secondary | ICD-10-CM

## 2013-03-28 NOTE — Patient Instructions (Signed)
I recommend a walker when walking to prevent falling.

## 2013-03-28 NOTE — Progress Notes (Signed)
   Subjective:    Patient ID: [name], female    DOB: November 27, 1924, 79 y.o.   MRN: [id number] "I have places on my feet that I need looked at."  Foot Pain This is a new problem. Episode onset: some time. The problem occurs intermittently. The problem has been unchanged. Associated symptoms include weakness. She has tried nothing for the symptoms.   This confused 79 year old white female is not able to explain what prominence that she has at this time. Multiple questioning elicits no specific areas of concern. Son is questioned about his mothers foot complaints and he is unsure about her problems if any.   Review of Systems  Constitutional: Positive for activity change.  Genitourinary: Positive for urgency.  Musculoskeletal: Positive for gait problem.  Neurological: Positive for weakness.  Psychiatric/Behavioral: Positive for hallucinations and behavioral problems. The patient is nervous/anxious.   All other systems reviewed and are negative.       Objective:   Physical Exam  79 year old white female presents with her son. She is confused and does not answer questions directly.  Vascular: DP and PT pulses are nonpalpable bilaterally  Neurological: Trace knee and ankle reflexes bilaterally  Dermatological: Atrophic skin without any hair growth bilaterally. Hypertrophic toenails x10 noted. No skin lesions noted bilaterally.  Musculoskeletal: Patient is unsteady gait pattern. The right rear foot appears more collapsed and pronated than left. Hallux interphalangeal is noted bilaterally.      Assessment & Plan:   Assessment: Confused patient with difficulty in understanding her foot complaints Hallux interphalangeus bilaterally Hyperpronation, flatfoot deformity right. Peripheral arterial disease bilaterally Gait disturbance bilaterally  Plan: At this time I discussed with patient his son that there are no obvious acute problems at this time. I encouraged patient to use  a walker to prevent falls.

## 2013-05-22 ENCOUNTER — Ambulatory Visit
Admission: RE | Admit: 2013-05-22 | Discharge: 2013-05-22 | Disposition: A | Discharge status: Home / Self Care | Payer: MEDICARE | Source: Ambulatory Visit | Attending: Internal Medicine | Admitting: Internal Medicine

## 2013-05-22 ENCOUNTER — Other Ambulatory Visit: Payer: Self-pay | Admitting: Internal Medicine

## 2013-05-22 DIAGNOSIS — R0989 Other specified symptoms and signs involving the circulatory and respiratory systems: Secondary | ICD-10-CM

## 2013-05-24 ENCOUNTER — Inpatient Hospital Stay (HOSPITAL_COMMUNITY)
Admission: EM | Admit: 2013-05-24 | Discharge: 2013-05-29 | DRG: 195 | Disposition: A | Discharge status: Skilled Nursing Facility | Payer: MEDICARE | Attending: Internal Medicine | Admitting: Internal Medicine

## 2013-05-24 ENCOUNTER — Emergency Department (HOSPITAL_COMMUNITY): Payer: MEDICARE

## 2013-05-24 ENCOUNTER — Encounter (HOSPITAL_COMMUNITY): Payer: Self-pay | Admitting: Emergency Medicine

## 2013-05-24 DIAGNOSIS — Z0000 Encounter for general adult medical examination without abnormal findings: Secondary | ICD-10-CM

## 2013-05-24 DIAGNOSIS — F0390 Unspecified dementia without behavioral disturbance: Secondary | ICD-10-CM | POA: Diagnosis present

## 2013-05-24 DIAGNOSIS — J189 Pneumonia, unspecified organism: Principal | ICD-10-CM | POA: Diagnosis present

## 2013-05-24 DIAGNOSIS — E876 Hypokalemia: Secondary | ICD-10-CM

## 2013-05-24 DIAGNOSIS — Z833 Family history of diabetes mellitus: Secondary | ICD-10-CM

## 2013-05-24 DIAGNOSIS — R413 Other amnesia: Secondary | ICD-10-CM

## 2013-05-24 DIAGNOSIS — I10 Essential (primary) hypertension: Secondary | ICD-10-CM | POA: Diagnosis present

## 2013-05-24 DIAGNOSIS — Z87891 Personal history of nicotine dependence: Secondary | ICD-10-CM

## 2013-05-24 DIAGNOSIS — L659 Nonscarring hair loss, unspecified: Secondary | ICD-10-CM | POA: Diagnosis present

## 2013-05-24 DIAGNOSIS — R748 Abnormal levels of other serum enzymes: Secondary | ICD-10-CM

## 2013-05-24 DIAGNOSIS — W19XXXA Unspecified fall, initial encounter: Secondary | ICD-10-CM | POA: Diagnosis present

## 2013-05-24 DIAGNOSIS — G608 Other hereditary and idiopathic neuropathies: Secondary | ICD-10-CM | POA: Diagnosis present

## 2013-05-24 DIAGNOSIS — Z823 Family history of stroke: Secondary | ICD-10-CM

## 2013-05-24 DIAGNOSIS — F03A0 Unspecified dementia, mild, without behavioral disturbance, psychotic disturbance, mood disturbance, and anxiety: Secondary | ICD-10-CM

## 2013-05-24 DIAGNOSIS — F0391 Unspecified dementia with behavioral disturbance: Secondary | ICD-10-CM

## 2013-05-24 DIAGNOSIS — Z66 Do not resuscitate: Secondary | ICD-10-CM | POA: Diagnosis present

## 2013-05-24 DIAGNOSIS — Z79899 Other long term (current) drug therapy: Secondary | ICD-10-CM

## 2013-05-24 DIAGNOSIS — F03918 Unspecified dementia, unspecified severity, with other behavioral disturbance: Secondary | ICD-10-CM

## 2013-05-24 DIAGNOSIS — G629 Polyneuropathy, unspecified: Secondary | ICD-10-CM

## 2013-05-24 DIAGNOSIS — E785 Hyperlipidemia, unspecified: Secondary | ICD-10-CM | POA: Diagnosis present

## 2013-05-24 LAB — CREATININE, SERUM
CREATININE: 0.48 mg/dL — AB (ref 0.50–1.10)
GFR calc Af Amer: >90 mL/min (ref >90)
GFR, EST NON AFRICAN AMERICAN: 86 mL/min — AB (ref >90)

## 2013-05-24 LAB — POCT I-STAT 3, ART BLOOD GAS (G3+)
ACID-BASE EXCESS: 3 mmol/L — AB (ref 0.0–2.0)
Bicarbonate: 27.5 mEq/L — ABNORMAL HIGH (ref 20.0–24.0)
O2 Saturation: 96 %
PH ART: 7.455 — AB (ref 7.350–7.450)
TCO2: 29 mmol/L (ref 0–100)
pCO2 arterial: 39.2 mmHg (ref 35.0–45.0)
pO2, Arterial: 81 mmHg (ref 80.0–100.0)

## 2013-05-24 LAB — COMPREHENSIVE METABOLIC PANEL
ALT: 22 U/L (ref 0–35)
AST: 38 U/L — AB (ref 0–37)
Albumin: 3.4 g/dL — ABNORMAL LOW (ref 3.5–5.2)
Alkaline Phosphatase: 82 U/L (ref 39–117)
BILIRUBIN TOTAL: 0.8 mg/dL (ref 0.3–1.2)
BUN: 10 mg/dL (ref 6–23)
CHLORIDE: 103 meq/L (ref 96–112)
CO2: 28 meq/L (ref 19–32)
Calcium: 9 mg/dL (ref 8.4–10.5)
Creatinine, Ser: 0.53 mg/dL (ref 0.50–1.10)
GFR calc Af Amer: >90 mL/min (ref >90)
GFR, EST NON AFRICAN AMERICAN: 84 mL/min — AB (ref >90)
Glucose, Bld: 109 mg/dL — ABNORMAL HIGH (ref 70–99)
POTASSIUM: 4.2 meq/L (ref 3.7–5.3)
Sodium: 143 mEq/L (ref 137–147)
Total Protein: 6.5 g/dL (ref 6.0–8.3)

## 2013-05-24 LAB — CBC
HCT: 41.5 % (ref 36.0–46.0)
HEMATOCRIT: 40.2 % (ref 36.0–46.0)
HEMOGLOBIN: 13.8 g/dL (ref 12.0–15.0)
Hemoglobin: 14.2 g/dL (ref 12.0–15.0)
MCH: 31.6 pg (ref 26.0–34.0)
MCH: 31.8 pg (ref 26.0–34.0)
MCHC: 34.2 g/dL (ref 30.0–36.0)
MCHC: 34.3 g/dL (ref 30.0–36.0)
MCV: 92.2 fL (ref 78.0–100.0)
MCV: 92.6 fL (ref 78.0–100.0)
PLATELETS: 237 10*3/uL (ref 150–400)
Platelets: 246 10*3/uL (ref 150–400)
RBC: 4.5 MIL/uL (ref 3.87–5.11)
RBC: 4.34 MIL/uL (ref 3.87–5.11)
RDW: 13.6 % (ref 11.5–15.5)
RDW: 13.8 % (ref 11.5–15.5)
WBC: 12.8 10*3/uL — AB (ref 4.0–10.5)
WBC: 10.7 10*3/uL — ABNORMAL HIGH (ref 4.0–10.5)

## 2013-05-24 LAB — URINALYSIS, ROUTINE W REFLEX MICROSCOPIC
BILIRUBIN URINE: NEGATIVE
Glucose, UA: NEGATIVE mg/dL
Hgb urine dipstick: NEGATIVE
Ketones, ur: 15 mg/dL — AB
Leukocytes, UA: NEGATIVE
Nitrite: NEGATIVE
PROTEIN: NEGATIVE mg/dL
Specific Gravity, Urine: 1.013 (ref 1.005–1.030)
UROBILINOGEN UA: 0.2 mg/dL (ref 0.0–1.0)
pH: 7 (ref 5.0–8.0)

## 2013-05-24 LAB — POCT I-STAT, CHEM 8
BUN: 8 mg/dL (ref 6–23)
Calcium, Ion: 1.15 mmol/L (ref 1.13–1.30)
Chloride: 100 mEq/L (ref 96–112)
Creatinine, Ser: 0.6 mg/dL (ref 0.50–1.10)
GLUCOSE: 99 mg/dL (ref 70–99)
HEMATOCRIT: 48 % — AB (ref 36.0–46.0)
Hemoglobin: 16.3 g/dL — ABNORMAL HIGH (ref 12.0–15.0)
POTASSIUM: 3.7 meq/L (ref 3.7–5.3)
Sodium: 142 mEq/L (ref 137–147)
TCO2: 27 mmol/L (ref 0–100)

## 2013-05-24 LAB — POCT I-STAT TROPONIN I: Troponin i, poc: 0.01 ng/mL (ref 0.00–0.08)

## 2013-05-24 LAB — PROTIME-INR
INR: 0.97 (ref 0.00–1.49)
Prothrombin Time: 12.7 seconds (ref 11.6–15.2)

## 2013-05-24 LAB — CG4 I-STAT (LACTIC ACID): Lactic Acid, Venous: 1.35 mmol/L (ref 0.5–2.2)

## 2013-05-24 LAB — CK: CK TOTAL: 912 U/L — AB (ref 7–177)

## 2013-05-24 LAB — TROPONIN I: Troponin I: <0.3 ng/mL (ref <0.30)

## 2013-05-24 MED ORDER — MECLIZINE HCL 12.5 MG PO TABS
12.5000 mg | ORAL_TABLET | Freq: Three times a day (TID) | ORAL | Status: DC | PRN
  Filled 2013-05-24: qty 1

## 2013-05-24 MED ORDER — HEPARIN SODIUM (PORCINE) 5000 UNIT/ML IJ SOLN
5000.0000 [IU] | Freq: Three times a day (TID) | INTRAMUSCULAR | Status: DC
  Administered 2013-05-24 – 2013-05-29 (×15): 5000 [IU] via SUBCUTANEOUS
  Filled 2013-05-24 (×18): qty 1

## 2013-05-24 MED ORDER — SIMVASTATIN 20 MG PO TABS
20.0000 mg | ORAL_TABLET | Freq: Every day | ORAL | Status: DC
  Administered 2013-05-24 – 2013-05-29 (×6): 20 mg via ORAL
  Filled 2013-05-24 (×6): qty 1

## 2013-05-24 MED ORDER — VITAMIN E 45 MG (100 UNIT) PO CAPS
100.0000 [IU] | ORAL_CAPSULE | Freq: Every day | ORAL | Status: DC
  Administered 2013-05-24 – 2013-05-29 (×6): 100 [IU] via ORAL
  Filled 2013-05-24 (×6): qty 1

## 2013-05-24 MED ORDER — OCUVITE EYE HEALTH FORMULA PO CAPS: 2.0000 | ORAL_CAPSULE | Freq: Two times a day (BID) | ORAL | Status: DC

## 2013-05-24 MED ORDER — VITAMIN C 500 MG PO TABS
1000.0000 mg | ORAL_TABLET | Freq: Every day | ORAL | Status: DC
  Administered 2013-05-24 – 2013-05-29 (×6): 1000 mg via ORAL
  Filled 2013-05-24 (×6): qty 2

## 2013-05-24 MED ORDER — ALUM & MAG HYDROXIDE-SIMETH 200-200-20 MG/5ML PO SUSP: 30.0000 mL | Freq: Four times a day (QID) | ORAL | Status: DC | PRN

## 2013-05-24 MED ORDER — GUAIFENESIN-DM 100-10 MG/5ML PO SYRP
5.0000 mL | ORAL_SOLUTION | ORAL | Status: DC | PRN
  Filled 2013-05-24: qty 5

## 2013-05-24 MED ORDER — DEXTROSE 5 % IV SOLN
500.0000 mg | Freq: Once | INTRAVENOUS | Status: AC
  Administered 2013-05-24: 500 mg via INTRAVENOUS

## 2013-05-24 MED ORDER — SODIUM CHLORIDE 0.9 % IV SOLN
INTRAVENOUS | Status: AC
  Administered 2013-05-24 – 2013-05-25 (×3): via INTRAVENOUS

## 2013-05-24 MED ORDER — DONEPEZIL HCL 10 MG PO TABS
10.0000 mg | ORAL_TABLET | Freq: Every day | ORAL | Status: DC
  Administered 2013-05-24 – 2013-05-28 (×5): 10 mg via ORAL
  Filled 2013-05-24 (×6): qty 1

## 2013-05-24 MED ORDER — OCUVITE-LUTEIN PO CAPS
2.0000 | ORAL_CAPSULE | Freq: Every day | ORAL | Status: DC
Start: 2013-05-24 — End: 2013-05-29
  Administered 2013-05-24 – 2013-05-29 (×6): 2 via ORAL
  Filled 2013-05-24 (×7): qty 2

## 2013-05-24 MED ORDER — ONDANSETRON HCL 4 MG PO TABS: 4.0000 mg | ORAL_TABLET | Freq: Four times a day (QID) | ORAL | Status: DC | PRN

## 2013-05-24 MED ORDER — ASPIRIN 81 MG PO CHEW
81.0000 mg | CHEWABLE_TABLET | Freq: Every day | ORAL | Status: DC
  Administered 2013-05-24 – 2013-05-29 (×6): 81 mg via ORAL
  Filled 2013-05-24 (×5): qty 1

## 2013-05-24 MED ORDER — POLYETHYLENE GLYCOL 3350 17 G PO PACK
17.0000 g | PACK | Freq: Every day | ORAL | Status: DC | PRN
  Filled 2013-05-24: qty 1

## 2013-05-24 MED ORDER — METOPROLOL SUCCINATE ER 25 MG PO TB24
25.0000 mg | ORAL_TABLET | Freq: Every day | ORAL | Status: DC
  Administered 2013-05-25 – 2013-05-29 (×5): 25 mg via ORAL
  Filled 2013-05-24 (×6): qty 1

## 2013-05-24 MED ORDER — DEXTROSE 5 % IV SOLN
1.0000 g | Freq: Once | INTRAVENOUS | Status: AC
  Administered 2013-05-24: 1 g via INTRAVENOUS
  Filled 2013-05-24: qty 10

## 2013-05-24 MED ORDER — SODIUM CHLORIDE 0.9 % IV BOLUS (SEPSIS)
500.0000 mL | Freq: Once | INTRAVENOUS | Status: AC
Start: 2013-05-24 — End: 2013-05-24
  Administered 2013-05-24: 500 mL via INTRAVENOUS

## 2013-05-24 MED ORDER — AZITHROMYCIN 500 MG PO TABS
500.0000 mg | ORAL_TABLET | ORAL | Status: DC
  Administered 2013-05-25 – 2013-05-26 (×2): 500 mg via ORAL
  Filled 2013-05-24 (×3): qty 1

## 2013-05-24 MED ORDER — DEXTROSE 5 % IV SOLN
1.0000 g | INTRAVENOUS | Status: DC
  Administered 2013-05-25 – 2013-05-26 (×2): 1 g via INTRAVENOUS
  Filled 2013-05-24 (×3): qty 10

## 2013-05-24 MED ORDER — SODIUM CHLORIDE 0.9 % IJ SOLN
3.0000 mL | Freq: Two times a day (BID) | INTRAMUSCULAR | Status: DC
  Administered 2013-05-25 – 2013-05-29 (×8): 3 mL via INTRAVENOUS

## 2013-05-24 MED ORDER — MIRTAZAPINE 30 MG PO TABS
30.0000 mg | ORAL_TABLET | Freq: Every day | ORAL | Status: DC
  Administered 2013-05-24 – 2013-05-28 (×5): 30 mg via ORAL
  Filled 2013-05-24 (×6): qty 1

## 2013-05-24 MED ORDER — MEMANTINE HCL ER 28 MG PO CP24
1.0000 | ORAL_CAPSULE | Freq: Every day | ORAL | Status: DC
  Administered 2013-05-24 – 2013-05-29 (×6): 28 mg via ORAL
  Filled 2013-05-24 (×6): qty 28

## 2013-05-24 MED ORDER — ONDANSETRON HCL 4 MG/2ML IJ SOLN: 4.0000 mg | Freq: Four times a day (QID) | INTRAMUSCULAR | Status: DC | PRN

## 2013-05-24 MED ORDER — HYDROCODONE-ACETAMINOPHEN 5-325 MG PO TABS
1.0000 | ORAL_TABLET | ORAL | Status: DC | PRN
  Administered 2013-05-26 – 2013-05-27 (×2): 2 via ORAL
  Administered 2013-05-28: 1 via ORAL
  Filled 2013-05-24 (×2): qty 2
  Filled 2013-05-24: qty 1

## 2013-05-24 NOTE — ED Provider Notes (Addendum)
CSN: [id number]     Arrival date & time 05/24/13  1344 History   None    Chief Complaint  Patient presents with  . Fall  . Altered Mental Status     Patient is a 80 y.o. female presenting with fall and altered mental status.  Fall This is a new problem. The current episode started today. The problem occurs rarely. The problem has been gradually improving. Pertinent negatives include no chest pain, chills, congestion, coughing, diaphoresis, fatigue, fever, joint swelling, myalgias, nausea, neck pain, numbness, sore throat, swollen glands, urinary symptoms, vertigo, visual change, vomiting or weakness. Nothing aggravates the symptoms. She has tried nothing for the symptoms.  Altered Mental Status Associated symptoms: no fever, no nausea, no visual change, no vomiting and no weakness     Past Medical History  Diagnosis Date  . Dyslipidemia   . Alopecia   . Arthritis   . Hereditary peripheral neuropathy(356.0)     neurology eval 3/12  . Hereditary neuropathy with pressure palsies (HNPP)   . PVC (premature ventricular contraction)     history  . Vertigo history  . Neuropathy     history  . Hyperlipidemia   . Hx of mitral valve prolapse     mild prolapse with mitral regurgitation   Past Surgical History  Procedure Laterality Date  . Dilation and curettage of uterus    . Tonsillectomy    . Bunionectomy     Family History  Problem Relation Age of Onset  . Other Father     heart valve infection  . Stroke Mother   . Diabetes Mother    History  Substance Use Topics  . Smoking status: Former Smoker -- 1.00 packs/day for 8 years    Quit date: 07/01/1963  . Smokeless tobacco: Never Used  . Alcohol Use: Yes     Comment: short one before I go to bed 1-2oz   OB History   Grav Para Term Preterm Abortions TAB SAB Ect Mult Living                 Review of Systems  Constitutional: Negative for fever, chills, diaphoresis and fatigue.  HENT: Negative for congestion and sore  throat.   Respiratory: Negative for cough.   Cardiovascular: Negative for chest pain.  Gastrointestinal: Negative for nausea and vomiting.  Musculoskeletal: Negative for joint swelling, myalgias and neck pain.  Neurological: Negative for vertigo, weakness and numbness.      Allergies  Review of patient's allergies indicates no known allergies.  Home Medications   Current Outpatient Rx  Name  Route  Sig  Dispense  Refill  . Ascorbic Acid (VITAMIN C) 1000 MG tablet   Oral   Take 1,000 mg by mouth daily.         Marland Kitchen. donepezil (ARICEPT) 10 MG tablet   Oral   Take 1 tablet (10 mg total) by mouth at bedtime.   30 tablet   12   . meclizine (ANTIVERT) 12.5 MG tablet   Oral   Take 12.5 mg by mouth 3 (three) times daily as needed for dizziness.         . Memantine HCl ER (NAMENDA XR) 28 MG CP24   Oral   Take 28 mg by mouth daily.   30 capsule   12   . metoprolol succinate (TOPROL-XL) 25 MG 24 hr tablet   Oral   Take 25 mg by mouth daily.         . mirtazapine (  REMERON) 15 MG tablet   Oral   Take 30 mg by mouth at bedtime.          . Multiple Vitamins-Minerals (OCUVITE EYE HEALTH FORMULA) CAPS   Oral   Take 2 capsules by mouth 2 (two) times daily.           . simvastatin (ZOCOR) 20 MG tablet   Oral   Take 20 mg by mouth daily.         . vitamin E 100 UNIT capsule   Oral   Take 100 Units by mouth daily.          BP 128/62  Pulse 83  Temp(Src) 97.7 F (36.5 C) (Oral)  Resp 19  Ht 5\' 4"  (1.626 m)  Wt 129 lb (58.514 kg)  BMI 22.13 kg/m2  SpO2 100% Physical Exam  General frail elderly white female lying in bed in NAD,  Awake but compeltely confused  No F.N deficits, C.Nerves grossly  Intact, Strength 5/5 all 4 extremities, Sensation intact all 4 extremities, Plantars down going.  Ears and Eyes appear Normal, Conjunctivae clear, PERRLA. Moist Oral Mucosa.  Supple Neck, No JVD, No cervical lymphadenopathy appriciated, No Carotid Bruits.  Symmetrical  Chest wall movement, Good air movement bilaterally,Rales in LLL  RRR, No Gallops, Rubs or Murmurs, No Parasternal Heave.   Abdomen Soft, Non tender, No rebound -guarding or rigidity. No distention. No Cyanosis, Normal Skin Turgor, No Skin Rash or Bruise.  Good muscle tone Normal ROM.       ED Course  Procedures (including critical care time) Labs Review Labs Reviewed  CBC - Abnormal; Notable for the following:    WBC 12.8 (*)    All other components within normal limits  COMPREHENSIVE METABOLIC PANEL - Abnormal; Notable for the following:    Glucose, Bld 109 (*)    Albumin 3.4 (*)    AST 38 (*)    GFR calc non Af Amer 84 (*)    All other components within normal limits  URINALYSIS, ROUTINE W REFLEX MICROSCOPIC - Abnormal; Notable for the following:    Ketones, ur 15 (*)    All other components within normal limits  CK - Abnormal; Notable for the following:    Total CK 912 (*)    All other components within normal limits  CBC - Abnormal; Notable for the following:    WBC 10.7 (*)    All other components within normal limits  CREATININE, SERUM - Abnormal; Notable for the following:    Creatinine, Ser 0.48 (*)    GFR calc non Af Amer 86 (*)    All other components within normal limits  POCT I-STAT 3, BLOOD GAS (G3+) - Abnormal; Notable for the following:    pH, Arterial 7.455 (*)    Bicarbonate 27.5 (*)    Acid-Base Excess 3.0 (*)    All other components within normal limits  POCT I-STAT, CHEM 8 - Abnormal; Notable for the following:    Hemoglobin 16.3 (*)    HCT 48.0 (*)    All other components within normal limits  URINE CULTURE  CULTURE, BLOOD (ROUTINE X 2)  CULTURE, BLOOD (ROUTINE X 2)  CULTURE, EXPECTORATED SPUTUM-ASSESSMENT  TROPONIN I  PROTIME-INR  CBC  BASIC METABOLIC PANEL  LEGIONELLA ANTIGEN, URINE  STREP PNEUMONIAE URINARY ANTIGEN  POCT I-STAT TROPONIN I  CG4 I-STAT (LACTIC ACID)   Imaging Review Dg Chest 2 View  05/24/2013   CLINICAL DATA:  Fall,  altered mental status  EXAM:  CHEST  2 VIEW  COMPARISON:  Prior chest x-ray 12/29/2009  FINDINGS: Interval development of patchy left lower lobe opacity which may reflect atelectasis or infiltrate. Stable mild cardiomegaly. Atherosclerotic calcifications noted in the transverse aorta. Stable background changes of mild chronic interstitial prominence and bronchitic changes. No pneumothorax or large effusion. No acute osseous abnormality.  IMPRESSION: 1. Patchy left lower lobe opacity may reflect atelectasis or infiltrate. 2. Stable cardiomegaly. 3. Aortic atherosclerosis.   Electronically Signed   By: Malachy Moan M.D.   On: 05/24/2013 15:18   Ct Head Wo Contrast  05/24/2013   CLINICAL DATA:  Status post fall.  Altered mental status.  EXAM: CT HEAD WITHOUT CONTRAST  TECHNIQUE: Contiguous axial images were obtained from the base of the skull through the vertex without intravenous contrast.  COMPARISON:  Head CT scan 02/07/2013.  FINDINGS: There is atrophy and chronic microvascular ischemic change. Remote left basal ganglia infarct is identified. No evidence of acute infarction, hemorrhage, mass lesion, mass effect, midline shift or abnormal extra-axial fluid collection. Small right mastoid effusion is noted. Calvarium intact.  IMPRESSION: No acute abnormality.  Atrophy and chronic microvascular ischemic change.   Electronically Signed   By: Drusilla Kanner M.D.   On: 05/24/2013 15:05      MDM   Final diagnoses:  Fall  CAP (community acquired pneumonia)  Health care maintenance  Memory loss  Mild dementia    80 yo F who was found down today at her ALF. Patient has underlying dementia. She currently denies any sxs. Concern for syncope vs fall. Lab work with mild leukocytosis, CXR with concern for infiltrate. Will treat for CAP. Head CT scan with NAICA. Rocephin and Azitrhomycin given in ED. Patient up dated multiple times throughout ED course. Case dsciussed with my attending Dr. Rubin Payor.  Patient was found down for unknown time CK sent and is elevated plan for NS hydration. No AKI at this point. At transfer patient remained HDS.     Nadara Mustard, MD 05/25/13 1610  Nadara Mustard, MD 06/12/13 [phone]

## 2013-05-24 NOTE — ED Notes (Signed)
Spoke with daughter-in-law, reports she had moderate dementia, sometimes doesn't know the year but always knows where she is. Right foot has been red and was taken to doctor on Tuesday for that, they performed and ultrasound to look for blood flow but haven't received the results yet. Family is 5 hrs away, best way to reach them is [phone], Elnita MaxwellCheryl. Pt was last seen by her medication aid around 8pm last night, not acting any different than her normal self. Per staff at [facility] - pt was tangled up in her sheets, suspecting she tripped and fell last night.

## 2013-05-24 NOTE — ED Notes (Signed)
Called pt's family to update them on labs and results from scans.

## 2013-05-24 NOTE — ED Notes (Signed)
Per EMS - pt coming from [facility]. Pt was found on the floor covered in feces and urine. Pt could have fallen last night but staff is unsure when it happened. Pt denies fall, sts she was just laying down there. EMS believes there is no history of dementia. Nad, skin warm and dry, resp e/u.

## 2013-05-24 NOTE — ED Notes (Signed)
Pt reports she doesn't know why she is here. She is in town visiting some family and nothing is wrong with her. Pt denies falling. Pt reports she only has a slight HA but no pain anywhere else. Pt has some redness to bottom, left elbow and right foot. Pt reports her right foot has been red and just went to the doctor for that but they never told her what was wrong with it. Pt seems very confused. Pt thinks she is 80 years old. Pt unable to tell staff [place], [place] [facility] [place] but then said "I've moved since then". Pt denies taking any medicine, but then said "I take medicine for my hair, nothing else". Pt unsure where she is at this time. Able to tell us the year is 2015, answer to who is the current president "I can't remember his name, but I don't like him". Pt in nad, skin warm and dry, resp e/u.

## 2013-05-24 NOTE — Progress Notes (Signed)
Pt admitted [facility]. Pt  oriented to staff, call bell, and room. Bed alarm on and bed in lowest position. Call bell within reach. Full assessment to Epic. Will continue to monitor.

## 2013-05-24 NOTE — ED Provider Notes (Signed)
MSE was initiated and I personally evaluated the patient and placed orders (if any) at  2:43 PM on May 24, 2013.  The patient appears stable so that the remainder of the MSE may be completed by another provider.  Pt is in nad.    Darlys Galesavid [place], MD 05/24/13 [phone]

## 2013-05-24 NOTE — H&P (Signed)
Patient Demographics  [name], is a 80 y.o. female  MRN: [id number]   DOB - November 24, 1924  Admit Date - 05/24/2013  Outpatient Primary MD for the patient is Joycelyn Schmid, MD   With History of -  Past Medical History  Diagnosis Date  . Dyslipidemia   . Alopecia   . Arthritis   . Hereditary peripheral neuropathy(356.0)     neurology eval 3/12  . Hereditary neuropathy with pressure palsies (HNPP)   . PVC (premature ventricular contraction)     history  . Vertigo history  . Neuropathy     history  . Hyperlipidemia   . Hx of mitral valve prolapse     mild prolapse with mitral regurgitation      Past Surgical History  Procedure Laterality Date  . Dilation and curettage of uterus    . Tonsillectomy    . Bunionectomy      in for   Chief Complaint  Patient presents with  . Fall  . Altered Mental Status     HPI  [name]  is a 80 y.o. female, lives in the independent living with history of advanced dementia, dyslipidemia, hypertension, neuropathy, mitral valve prolapse, recently diagnosed PAD found on outpatient arterial ultrasound done on the 10th of this month, was brought in by EMS she was found lying on the floor at the independent living facility covered in feces, she was brought in the ER where her workup was consistent with community-acquired pneumonia, patient has advanced dementia and she denies ever falling or being in any discomfort whatsoever, she denies any fever cough or shortness of breath, denies any body aches. Denies of any joint pains or aches. No hip pain. She still thinks she is at her house and she is fine. Her only complaint is that she feels hungry and wants food.    Review of Systems  completely negative review of systems  In addition to the HPI above,  No  Fever-chills, No Headache, No changes with Vision or hearing, No problems swallowing food or Liquids, No Chest pain, Cough or Shortness of Breath, No Abdominal pain, No Nausea or Vommitting, Bowel movements are regular, No Blood in stool or Urine, No dysuria, No new skin rashes or bruises, No new joints pains-aches,  No new weakness, tingling, numbness in any extremity, No recent weight gain or loss, No polyuria, polydypsia or polyphagia, No significant Mental Stressors.  A full 10 point Review of Systems was done, except as stated above, all other Review of Systems were negative.   Social History History  Substance Use Topics  . Smoking status: Former Smoker -- 1.00 packs/day for 8 years    Quit date: 07/01/1963  . Smokeless tobacco: Never Used  . Alcohol Use: Yes     Comment: short one before I go to bed 1-2oz      Family History Family History  Problem Relation Age of Onset  . Other Father  heart valve infection  . Stroke Mother   . Diabetes Mother       Prior to Admission medications   Medication Sig Start Date End Date Taking? Authorizing Provider  Ascorbic Acid (VITAMIN C) 1000 MG tablet Take 1,000 mg by mouth daily.   Yes Historical Provider, MD  donepezil (ARICEPT) 10 MG tablet Take 1 tablet (10 mg total) by mouth at bedtime. 11/17/12  Yes Suanne Marker, MD  meclizine (ANTIVERT) 12.5 MG tablet Take 12.5 mg by mouth 3 (three) times daily as needed for dizziness.   Yes Historical Provider, MD  Memantine HCl ER (NAMENDA XR) 28 MG CP24 Take 28 mg by mouth daily. 03/19/13  Yes Suanne Marker, MD  metoprolol succinate (TOPROL-XL) 25 MG 24 hr tablet Take 25 mg by mouth daily.   Yes Historical Provider, MD  mirtazapine (REMERON) 15 MG tablet Take 30 mg by mouth at bedtime.  02/16/13  Yes Historical Provider, MD  Multiple Vitamins-Minerals (OCUVITE EYE HEALTH FORMULA) CAPS Take 2 capsules by mouth 2 (two) times daily.     Yes Historical Provider, MD  simvastatin  (ZOCOR) 20 MG tablet Take 20 mg by mouth daily.   Yes Historical Provider, MD  vitamin E 100 UNIT capsule Take 100 Units by mouth daily.   Yes Historical Provider, MD    No Known Allergies  Physical Exam  Vitals  Blood pressure 128/62, pulse 83, temperature 97.7 F (36.5 C), temperature source Oral, resp. rate 19, height 5\' 4"  (1.626 m), weight 58.514 kg (129 lb), SpO2 100.00%.   1. General frail elderly white female lying in bed in NAD,     2. Normal affect and insight, Not Suicidal or Homicidal, Awake but compeltely confused  3. No F.N deficits, ALL C.Nerves Intact, Strength 5/5 all 4 extremities, Sensation intact all 4 extremities, Plantars down going.  4. Ears and Eyes appear Normal, Conjunctivae clear, PERRLA. Moist Oral Mucosa.  5. Supple Neck, No JVD, No cervical lymphadenopathy appriciated, No Carotid Bruits.  6. Symmetrical Chest wall movement, Good air movement bilaterally, CTAB.  7. RRR, No Gallops, Rubs or Murmurs, No Parasternal Heave.  8. Positive Bowel Sounds, Abdomen Soft, Non tender, No organomegaly appriciated,No rebound -guarding or rigidity.  9.  No Cyanosis, Normal Skin Turgor, No Skin Rash or Bruise.  10. Good muscle tone,  joints appear normal , no effusions, Normal ROM.  11. No Palpable Lymph Nodes in Neck or Axillae     Data Review  CBC  Recent Labs Lab 05/24/13 1445 05/24/13 1656  WBC 12.8*  --   HGB 14.2 16.3*  HCT 41.5 48.0*  PLT 237  --   MCV 92.2  --   MCH 31.6  --   MCHC 34.2  --   RDW 13.6  --    ------------------------------------------------------------------------------------------------------------------  Chemistries   Recent Labs Lab 05/24/13 1445 05/24/13 1656  NA 143 142  K 4.2 3.7  CL 103 100  CO2 28  --   GLUCOSE 109* 99  BUN 10 8  CREATININE 0.53 0.60  CALCIUM 9.0  --   AST 38*  --   ALT 22  --   ALKPHOS 82  --   BILITOT 0.8  --     ------------------------------------------------------------------------------------------------------------------ estimated creatinine clearance is 43.6 ml/min (by C-G formula based on Cr of 0.6). ------------------------------------------------------------------------------------------------------------------ No results found for this basename: TSH, T4TOTAL, FREET3, T3FREE, THYROIDAB,  in the last 72 hours   Coagulation profile No results found for this basename: INR, PROTIME,  in the last 168 hours ------------------------------------------------------------------------------------------------------------------- No results found for this basename: DDIMER,  in the last 72 hours -------------------------------------------------------------------------------------------------------------------  Cardiac Enzymes  Recent Labs Lab 05/24/13 1445  TROPONINI <0.30   ------------------------------------------------------------------------------------------------------------------ No components found with this basename: POCBNP,    ---------------------------------------------------------------------------------------------------------------  Urinalysis    Component Value Date/Time   COLORURINE YELLOW 02/07/2013 1356   APPEARANCEUR CLOUDY* 02/07/2013 1356   LABSPEC 1.013 02/07/2013 1356   PHURINE 7.0 02/07/2013 1356   GLUCOSEU NEGATIVE 02/07/2013 1356   HGBUR NEGATIVE 02/07/2013 1356   BILIRUBINUR NEGATIVE 02/07/2013 1356   BILIRUBINUR neg 10/09/2010 1539   KETONESUR NEGATIVE 02/07/2013 1356   PROTEINUR NEGATIVE 02/07/2013 1356   UROBILINOGEN 0.2 02/07/2013 1356   UROBILINOGEN NEG 10/09/2010 1539   NITRITE NEGATIVE 02/07/2013 1356   NITRITE NEG 10/09/2010 1539   LEUKOCYTESUR SMALL* 02/07/2013 1356    ----------------------------------------------------------------------------------------------------------------  Imaging results:   Dg Chest 2 View  05/24/2013   CLINICAL DATA:   Fall, altered mental status  EXAM: CHEST  2 VIEW  COMPARISON:  Prior chest x-ray 12/29/2009  FINDINGS: Interval development of patchy left lower lobe opacity which may reflect atelectasis or infiltrate. Stable mild cardiomegaly. Atherosclerotic calcifications noted in the transverse aorta. Stable background changes of mild chronic interstitial prominence and bronchitic changes. No pneumothorax or large effusion. No acute osseous abnormality.  IMPRESSION: 1. Patchy left lower lobe opacity may reflect atelectasis or infiltrate. 2. Stable cardiomegaly. 3. Aortic atherosclerosis.   Electronically Signed   By: Malachy MoanHeath  McCullough M.D.   On: 05/24/2013 15:18   Ct Head Wo Contrast  05/24/2013   CLINICAL DATA:  Status post fall.  Altered mental status.  EXAM: CT HEAD WITHOUT CONTRAST  TECHNIQUE: Contiguous axial images were obtained from the base of the skull through the vertex without intravenous contrast.  COMPARISON:  Head CT scan 02/07/2013.  FINDINGS: There is atrophy and chronic microvascular ischemic change. Remote left basal ganglia infarct is identified. No evidence of acute infarction, hemorrhage, mass lesion, mass effect, midline shift or abnormal extra-axial fluid collection. Small right mastoid effusion is noted. Calvarium intact.  IMPRESSION: No acute abnormality.  Atrophy and chronic microvascular ischemic change.   Electronically Signed   By: Drusilla Kannerhomas  Dalessio M.D.   On: 05/24/2013 15:05   [place] Arterial Seg Multiple  05/22/2013   CLINICAL DATA:  Right great toe pain, ulceration, decreased pulses  EXAM: NONINVASIVE PHYSIOLOGIC VASCULAR STUDY OF BILATERAL LOWER EXTREMITIES  TECHNIQUE: Evaluation of both lower extremities was performed at rest, including calculation of ankle-brachial indices, multiple segmental pressure evaluation, segmental Doppler and segmental pulse volume recording.  COMPARISON:  None.  FINDINGS: Right ABI:  0.6  Left ABI:  0.6  Right Lower Extremity: Focal transition from normal triphasic  pulses in the proximal femoral artery to an abnormal monophasic pulse in the popliteal arteries suggesting occlusive SFA disease. The posterior tibial and dorsalis pedis vessels remain patent with monophasic pulses. Focal attenuation of the pulse volume recording from the upper to lower thigh are concordant.  Left Lower Extremity: Focal transition from normal triphasic pulses in the proximal femoral artery to an abnormal monophasic pulse in the popliteal arteries suggesting occlusive SFA disease. The posterior tibial and dorsalis pedis vessels remain patent with monophasic pulses. Focal attenuation of the pulse volume recording from the upper to lower thigh are concordant.  IMPRESSION: 1. Relatively symmetric moderately severe bilateral occlusive peripheral arterial disease. 2. The level of the disease appears to be outflow (superficial femoral artery) bilaterally. Consider further evaluation with CTA or  MRA with runoff to fully evaluate the extent of the peripheral arterial disease. Additionally, consultation with a peripheral endovascular specialist such as Interventional Radiology, or Vascular Surgery may be beneficial.  Signed,  Sterling Big, MD  [facility]  [facility] [phone]   Electronically Signed   By: Malachy Moan M.D.   On: 05/22/2013 15:57    My personal review of EKG: Rhythm NSR, Rate 82 /min, few PVCs, no Acute ST changes    Assessment & Plan   1.CAP - the patient who lives in independent living, will be admitted to a telemetry bed as she had an episode of fall, blood and sputum cultures, IV Rocephin and azithromycin, oxygen and nebulizer treatments as needed.   2. Advanced dementia. At risk for delirium, continue home medications, fall precautions, feeding assistance.    3. Hypertension. Continue home dose beta blocker.   4. Dyslipidemia continue statin at home dose.   5. Recently diagnosed PAD. Will add aspirin,  continue statin for secondary prevention.   6. Fall at independent living. Likely due to generalized weakness from #1 above, patient is completely unaware of the fall, no joint pains or aches, monitor on telemetry to rule out dysrhythmia, PT eval, may need SNF placement.     DVT Prophylaxis Heparin   AM Labs Ordered, also please review Full Orders  Family Communication: Admission, patients condition and plan of care including tests being ordered have been discussed with the patient and daughter in law over the phone who indicate understanding and agree with the plan and Code Status.  Code Status DNR  Likely DC to  SNF  Condition GUARDED     Time spent in minutes : 35    SINGH,PRASHANT K M.D on 05/24/2013 at 5:13 PM  Between 7am to 7pm - Pager - [phone]  After 7pm go to [email or web address] - password [email or web address]  And look for the night coverage person covering me after hours  [facility] Office  [phone]

## 2013-05-25 ENCOUNTER — Inpatient Hospital Stay (HOSPITAL_COMMUNITY): Payer: MEDICARE

## 2013-05-25 DIAGNOSIS — R748 Abnormal levels of other serum enzymes: Secondary | ICD-10-CM

## 2013-05-25 LAB — URINE CULTURE
Colony Count: NO GROWTH
Culture: NO GROWTH

## 2013-05-25 LAB — CBC
HEMATOCRIT: 36.6 % (ref 36.0–46.0)
HEMOGLOBIN: 12.4 g/dL (ref 12.0–15.0)
MCH: 31.6 pg (ref 26.0–34.0)
MCHC: 33.9 g/dL (ref 30.0–36.0)
MCV: 93.4 fL (ref 78.0–100.0)
Platelets: 214 10*3/uL (ref 150–400)
RBC: 3.92 MIL/uL (ref 3.87–5.11)
RDW: 14.2 % (ref 11.5–15.5)
WBC: 7.8 10*3/uL (ref 4.0–10.5)

## 2013-05-25 LAB — BASIC METABOLIC PANEL
BUN: 10 mg/dL (ref 6–23)
CO2: 26 mEq/L (ref 19–32)
CREATININE: 0.57 mg/dL (ref 0.50–1.10)
Calcium: 8.2 mg/dL — ABNORMAL LOW (ref 8.4–10.5)
Chloride: 104 mEq/L (ref 96–112)
GFR calc Af Amer: >90 mL/min (ref >90)
GFR, EST NON AFRICAN AMERICAN: 82 mL/min — AB (ref >90)
GLUCOSE: 94 mg/dL (ref 70–99)
POTASSIUM: 3.7 meq/L (ref 3.7–5.3)
Sodium: 138 mEq/L (ref 137–147)

## 2013-05-25 MED ORDER — ACETAMINOPHEN 325 MG PO TABS
650.0000 mg | ORAL_TABLET | Freq: Four times a day (QID) | ORAL | Status: DC | PRN
  Administered 2013-05-25: 650 mg via ORAL
  Filled 2013-05-25: qty 2

## 2013-05-25 NOTE — Progress Notes (Signed)
Late Entry  Attempted to obtain patient admission history. Pt unable to answer questions.

## 2013-05-25 NOTE — Progress Notes (Signed)
[facility] PROGRESS NOTE  [name] ZOX:[id number] DOB: 1925-03-02 DOA: 05/24/2013 PCP: Joycelyn SchmidPENUMALLI,VIKRAM, MD  Assessment/Plan: CAP -  -IV Rocephin and azithromycin -oxygen and nebulizer treatments as needed.   Advanced dementia. At risk for delirium, continue home medications, fall precautions, feeding assistance.   Hypertension. Continue home dose beta blocker.   Dyslipidemia continue statin at home dose.   Recently diagnosed PAD. Will add aspirin, continue statin for secondary prevention.   Fall at independent living. Likely due to generalized weakness from #1 above, -right knee x ray -PRN pain meds  Increased CPK -IVF   Code Status: full Family Communication: patient Disposition Plan: PT eval- SNF   Consultants:    Procedures:    Antibiotics:    HPI/Subjective: C/o pain in right knee  Objective: Filed Vitals:   05/25/13 0418  BP: 94/48  Pulse: 74  Temp: 97.4 F (36.3 C)  Resp: 24    Intake/Output Summary (Last 24 hours) at 05/25/13 0815 Last data filed at 05/24/13 1828  Gross per 24 hour  Intake    120 ml  Output      0 ml  Net    120 ml   Filed Weights   05/24/13 1353 05/24/13 2057  Weight: 58.514 kg (129 lb) 56.6 kg (124 lb 12.5 oz)    Exam:   General:  A+Ox3 NAD  Cardiovascular: rrr  Respiratory: clear, no wheezing  Abdomen: +BS, soft  Musculoskeletal: moves all 4 ext  Data Reviewed: Basic Metabolic Panel:  Recent Labs Lab 05/24/13 1445 05/24/13 1656 05/24/13 1900 05/25/13 0551  NA 143 142  --  138  K 4.2 3.7  --  3.7  CL 103 100  --  104  CO2 28  --   --  26  GLUCOSE 109* 99  --  94  BUN 10 8  --  10  CREATININE 0.53 0.60 0.48* 0.57  CALCIUM 9.0  --   --  8.2*   Liver Function Tests:  Recent Labs Lab 05/24/13 1445  AST 38*  ALT 22  ALKPHOS 82  BILITOT 0.8  PROT 6.5  ALBUMIN 3.4*   No results found for this basename: LIPASE, AMYLASE,  in the last 168 hours No results found for this  basename: AMMONIA,  in the last 168 hours CBC:  Recent Labs Lab 05/24/13 1445 05/24/13 1656 05/24/13 1900 05/25/13 0551  WBC 12.8*  --  10.7* 7.8  HGB 14.2 16.3* 13.8 12.4  HCT 41.5 48.0* 40.2 36.6  MCV 92.2  --  92.6 93.4  PLT 237  --  246 214   Cardiac Enzymes:  Recent Labs Lab 05/24/13 1445 05/24/13 1530  CKTOTAL  --  912*  TROPONINI <0.30  --    BNP (last 3 results) No results found for this basename: PROBNP,  in the last 8760 hours CBG: No results found for this basename: GLUCAP,  in the last 168 hours  No results found for this or any previous visit (from the past 240 hour(s)).   Studies: Dg Chest 2 View  05/24/2013   CLINICAL DATA:  Fall, altered mental status  EXAM: CHEST  2 VIEW  COMPARISON:  Prior chest x-ray 12/29/2009  FINDINGS: Interval development of patchy left lower lobe opacity which may reflect atelectasis or infiltrate. Stable mild cardiomegaly. Atherosclerotic calcifications noted in the transverse aorta. Stable background changes of mild chronic interstitial prominence and bronchitic changes. No pneumothorax or large effusion. No acute osseous abnormality.  IMPRESSION: 1. Patchy left lower lobe opacity  may reflect atelectasis or infiltrate. 2. Stable cardiomegaly. 3. Aortic atherosclerosis.   Electronically Signed   By: Malachy Moan M.D.   On: 05/24/2013 15:18   Ct Head Wo Contrast  05/24/2013   CLINICAL DATA:  Status post fall.  Altered mental status.  EXAM: CT HEAD WITHOUT CONTRAST  TECHNIQUE: Contiguous axial images were obtained from the base of the skull through the vertex without intravenous contrast.  COMPARISON:  Head CT scan 02/07/2013.  FINDINGS: There is atrophy and chronic microvascular ischemic change. Remote left basal ganglia infarct is identified. No evidence of acute infarction, hemorrhage, mass lesion, mass effect, midline shift or abnormal extra-axial fluid collection. Small right mastoid effusion is noted. Calvarium intact.   IMPRESSION: No acute abnormality.  Atrophy and chronic microvascular ischemic change.   Electronically Signed   By: Drusilla Kanner M.D.   On: 05/24/2013 15:05    Scheduled Meds: . aspirin  81 mg Oral Daily  . azithromycin  500 mg Oral Q24H  . cefTRIAXone (ROCEPHIN)  IV  1 g Intravenous Q24H  . donepezil  10 mg Oral QHS  . heparin  5,000 Units Subcutaneous 3 times per day  . Memantine HCl ER  1 capsule Oral Daily  . metoprolol succinate  25 mg Oral Daily  . mirtazapine  30 mg Oral QHS  . multivitamin-lutein  2 capsule Oral Daily  . simvastatin  20 mg Oral Daily  . sodium chloride  3 mL Intravenous Q12H  . vitamin C  1,000 mg Oral Daily  . vitamin E  100 Units Oral Daily   Continuous Infusions: . sodium chloride 75 mL/hr at 05/25/13 1610    Active Problems:   Mild dementia   Neuropathy   Memory loss   Fall   CAP (community acquired pneumonia)    Time spent: 42    Marlin Canary  [facility] Pager [phone]. If 7PM-7AM, please contact night-coverage at [email or web address], password [facility] 05/25/2013, 8:15 AM  LOS: 1 day

## 2013-05-25 NOTE — Evaluation (Addendum)
Physical Therapy Evaluation Patient Details Name: [name] MRN: [id number] DOB: [birth year] Today's Date: 05/25/2013 Time: [id number] PT Time Calculation (min): 24 min  PT Assessment / Plan / Recommendation History of Present Illness  [name]  is a 80 y.o. female, lives in the independent living with history of advanced dementia, dyslipidemia, hypertension, neuropathy, mitral valve prolapse, recently diagnosed PAD, was brought in by EMS she was found lying on the floor at the independent living facility covered in feces, she was brought in the ER where her workup was consistent with community-acquired pneumonia.  Clinical Impression  Patient presents with decreased independence with mobility due to deficits listed below.  Deferred attempts at standing due to pt resisting and with right knee pain and swelling with x-ray pending.  Will benefit from skilled PT in the acute setting to allow return to ILF setting after SNF level rehab.     PT Assessment  Patient needs continued PT services    Follow Up Recommendations  SNF    Does the patient have the potential to tolerate intense rehabilitation    N/A  Barriers to Discharge  None      Equipment Recommendations  None recommended by PT    Recommendations for Other Services   None  Frequency Min 3X/week    Precautions / Restrictions Precautions Precautions: Fall Restrictions Weight Bearing Restrictions: No   Pertinent Vitals/Pain Right knee pain with movement      Mobility  Bed Mobility Overal bed mobility: Needs Assistance Bed Mobility: Rolling;Sidelying to Sit;Sit to Supine Rolling: Mod assist Sidelying to sit: Max assist Sit to supine: Max assist General bed mobility comments: Patient cued to use rail to assist with rolling, but unable to reach, assist to turn, then assist for lifting trunk after assisting legs off bed, lowered back to supine and lifted feet; [facility] trend[facility] with cues to  push through left LE Transfers General transfer comment: NT due to c/o pain right knee with swelling and pt resisting even up to edge of bed    Exercises General Exercises - Upper Extremity Shoulder Flexion: AAROM;Both;5 reps;Supine General Exercises - Lower Extremity Heel Slides: AAROM;Both;5 reps;Supine   PT Diagnosis: Acute pain;Generalized weakness;Difficulty walking  PT Problem List: Decreased strength;Pain;Decreased range of motion;Decreased activity tolerance;Decreased knowledge of use of DME;Decreased balance;Decreased safety awareness;Decreased mobility PT Treatment Interventions: DME instruction;Balance training;Gait training;Functional mobility training;Patient/family education;Therapeutic activities;Therapeutic exercise     PT Goals(Current goals can be found in the care plan section) Acute Rehab PT Goals Patient Stated Goal: To feel better PT Goal Formulation: With patient Time For Goal Achievement: 06/08/13 Potential to Achieve Goals: Fair  Visit Information  Last PT Received On: 05/25/13 Assistance Needed: +2 (for safety with OOB) History of Present Illness: [name]  is a 80 y.o. female, lives in the independent living with history of advanced dementia, dyslipidemia, hypertension, neuropathy, mitral valve prolapse, recently diagnosed PAD, was brought in by EMS she was found lying on the floor at the independent living facility covered in feces, she was brought in the ER where her workup was consistent with community-acquired pneumonia.       Prior Functioning  Home Living Family/patient expects to be discharged to:: Private residence Living Arrangements: Alone Type of Home: Independent living facility Home Layout: One level Home Equipment: Walker - 4 wheels Prior Function Level of Independence: Independent with assistive device(s) Communication Communication: No difficulties    Cognition  Cognition Arousal/Alertness: Awake/alert Behavior During  [facility]  tasks assessed/performed Overall Cognitive Status: No family/caregiver present to determine baseline cognitive functioning (h/o dementia, pt not oriented and firmly believes she was not on the floor at facility)    Extremity/Trunk Assessment Upper Extremity Assessment Upper Extremity Assessment: RUE deficits/detail;LUE deficits/detail RUE Deficits / Details: AAROM WFL, painful shoulder elevation; overall strength 2+/5 LUE Deficits / Details: AAROM WFL overall strength 2+/5 Lower Extremity Assessment Lower Extremity Assessment: RLE deficits/detail;LLE deficits/detail RLE Deficits / Details: AAROM limited knee flexion and painful with noted edema lateral to patella; strength NT due to pain RLE: Unable to fully assess due to pain LLE Deficits / Details: AROM WFL, strength grossly 3/5   Balance Balance Overall balance assessment: Needs assistance;History of Falls Sitting-balance support: Feet unsupported;Single extremity supported Sitting balance-Leahy Scale: Poor Sitting balance - Comments: pt resistant to sitting up and initially pushing back, did improve postural control once calmed and encouraged by not trying to stand; able to sit edge of bed briefly about 1-2 minutes max Postural control: Posterior lean  End of Session PT - End of Session Activity Tolerance: Patient limited by pain;Patient limited by fatigue Patient left: in bed;with call bell/phone within reach;with bed alarm set Nurse Communication: Mobility status  GP     [facility] 05/25/2013, 11:37 AM Sheran Lawless, PT [phone] 05/25/2013

## 2013-05-26 DIAGNOSIS — E876 Hypokalemia: Secondary | ICD-10-CM

## 2013-05-26 LAB — BASIC METABOLIC PANEL
BUN: 8 mg/dL (ref 6–23)
CHLORIDE: 102 meq/L (ref 96–112)
CO2: 23 mEq/L (ref 19–32)
CREATININE: 0.48 mg/dL — AB (ref 0.50–1.10)
Calcium: 8.4 mg/dL (ref 8.4–10.5)
GFR calc non Af Amer: 86 mL/min — ABNORMAL LOW (ref >90)
Glucose, Bld: 98 mg/dL (ref 70–99)
POTASSIUM: 3.5 meq/L — AB (ref 3.7–5.3)
SODIUM: 139 meq/L (ref 137–147)

## 2013-05-26 LAB — CBC
HCT: 37.7 % (ref 36.0–46.0)
Hemoglobin: 12.9 g/dL (ref 12.0–15.0)
MCH: 31.9 pg (ref 26.0–34.0)
MCHC: 34.2 g/dL (ref 30.0–36.0)
MCV: 93.1 fL (ref 78.0–100.0)
PLATELETS: 219 10*3/uL (ref 150–400)
RBC: 4.05 MIL/uL (ref 3.87–5.11)
RDW: 14.2 % (ref 11.5–15.5)
WBC: 11.9 10*3/uL — AB (ref 4.0–10.5)

## 2013-05-26 LAB — CK: CK TOTAL: 275 U/L — AB (ref 7–177)

## 2013-05-26 LAB — STREP PNEUMONIAE URINARY ANTIGEN: STREP PNEUMO URINARY ANTIGEN: NEGATIVE

## 2013-05-26 MED ORDER — POTASSIUM CHLORIDE CRYS ER 20 MEQ PO TBCR
40.0000 meq | EXTENDED_RELEASE_TABLET | Freq: Once | ORAL | Status: AC
  Administered 2013-05-26: 40 meq via ORAL
  Filled 2013-05-26: qty 2

## 2013-05-26 NOTE — Clinical Social Work Psychosocial (Signed)
Clinical Social Work Department BRIEF PSYCHOSOCIAL ASSESSMENT 05/26/2013  Patient:  [name], [name]     Account Number:  [id number]     Admit date:  05/24/2013  Clinical Social Worker:  Lovey Newcomer  Date/Time:  05/26/2013 04:10 PM  Referred by:  Physician  Date Referred:  05/26/2013 Referred for  SNF Placement   Other Referral:   Interview type:  Patient Other interview type:   Patient interviewed at bedside.    PSYCHOSOCIAL DATA Living Status:  FACILITY Admitted from facility:  [facility] Level of care:  Independent Living Primary support name:  Idelle Leech Primary support relationship to patient:  CHILD, ADULT Degree of support available:   Support is good. Patient states that she has four sons.    CURRENT CONCERNS Current Concerns  Post-Acute Placement   Other Concerns:    SOCIAL WORK ASSESSMENT / PLAN CSW met with patient at bedside to discuss recommendation for SNF. Patient was not optimistic about SNF placement, but after speaking with patient about the benefits of completing rehab before returning to independent living at [facility], she was more open to considering short term SNF placement for rehab. Patient became tearful as she explained that she was able to live independently until a couple of weeks ago and is hopeful that she can return to living independently. CSW offered emotional support to patient. Patient states that she would consider SNF if it's something she needs to do to get back home at [facility]. CSW explained SNF search and placement process to patient. CSW to follow up with bed offers.   Assessment/plan status:  Psychosocial Support/Ongoing Assessment of Needs Other assessment/ plan:   Complete FL2, Fax, PASRR   Information/referral to community resources:   SNF list given to patient.    PATIENT'S/FAMILY'S RESPONSE TO PLAN OF CARE: Patient is willing to consider SNF placement in [facility]. Patient was apprehensive and  tearful during the assessment because she really would like to just return home at [place]. Patient plans to think about the option of going to SNF. CSW will assist with DC when appropriate.       Liz Beach, [place], [place], [phone]

## 2013-05-26 NOTE — Progress Notes (Addendum)
[facility] PROGRESS NOTE  [name] GNF:[id number] DOB: 08-12-1924 DOA: 05/24/2013 PCP: Joycelyn SchmidPENUMALLI,VIKRAM, MD  Assessment/Plan: CAP -  -IV Rocephin and azithromycin -oxygen and nebulizer treatments as needed.   Advanced dementia. At risk for delirium, continue home medications, fall precautions, feeding assistance. -patient more oriented today   Hypertension. Continue home dose beta blocker.   Dyslipidemia continue statin at home dose.   Recently diagnosed PAD. Will add aspirin, continue statin for secondary prevention.   Fall at independent living. Likely due to generalized weakness from #1 above, -right knee x ray shows effusion but no fracture -PRN pain meds  Increased CPK -IVF -decreasing   Code Status: DNR Family Communication: son/daughter in law Disposition Plan:  SNF   Consultants:    Procedures:    Antibiotics:    HPI/Subjective: Feeling better oriented to person/date Says she did not sleep well as she had "troubles" last PM   Objective: Filed Vitals:   05/26/13 0437  BP: 140/64  Pulse: 90  Temp: 97.4 F (36.3 C)  Resp: 20    Intake/Output Summary (Last 24 hours) at 05/26/13 0845 Last data filed at 05/25/13 1748  Gross per 24 hour  Intake    170 ml  Output      0 ml  Net    170 ml   Filed Weights   05/24/13 1353 05/24/13 2057 05/25/13 2041  Weight: 58.514 kg (129 lb) 56.6 kg (124 lb 12.5 oz) 57.5 kg (126 lb 12.2 oz)    Exam:   General:  Pleasant/cooperative  Cardiovascular: rrr  Respiratory: clear, no wheezing  Abdomen: +BS, soft  Musculoskeletal: moves all 4 ext  Data Reviewed: Basic Metabolic Panel:  Recent Labs Lab 05/24/13 1445 05/24/13 1656 05/24/13 1900 05/25/13 0551 05/26/13 0330  NA 143 142  --  138 139  K 4.2 3.7  --  3.7 3.5*  CL 103 100  --  104 102  CO2 28  --   --  26 23  GLUCOSE 109* 99  --  94 98  BUN 10 8  --  10 8  CREATININE 0.53 0.60 0.48* 0.57 0.48*  CALCIUM 9.0  --   --   8.2* 8.4   Liver Function Tests:  Recent Labs Lab 05/24/13 1445  AST 38*  ALT 22  ALKPHOS 82  BILITOT 0.8  PROT 6.5  ALBUMIN 3.4*   No results found for this basename: LIPASE, AMYLASE,  in the last 168 hours No results found for this basename: AMMONIA,  in the last 168 hours CBC:  Recent Labs Lab 05/24/13 1445 05/24/13 1656 05/24/13 1900 05/25/13 0551 05/26/13 0330  WBC 12.8*  --  10.7* 7.8 11.9*  HGB 14.2 16.3* 13.8 12.4 12.9  HCT 41.5 48.0* 40.2 36.6 37.7  MCV 92.2  --  92.6 93.4 93.1  PLT 237  --  246 214 219   Cardiac Enzymes:  Recent Labs Lab 05/24/13 1445 05/24/13 1530 05/26/13 0330  CKTOTAL  --  912* 275*  TROPONINI <0.30  --   --    BNP (last 3 results) No results found for this basename: PROBNP,  in the last 8760 hours CBG: No results found for this basename: GLUCAP,  in the last 168 hours  Recent Results (from the past 240 hour(s))  URINE CULTURE     Status: None   Collection Time    05/24/13  4:43 PM      Result Value Ref Range Status   Specimen Description URINE, CATHETERIZED  Final   Special Requests NONE   Final   Culture  Setup Time     Final   Value: 05/24/2013 19:00     Performed at [facility] Count     Final   Value: NO GROWTH     Performed at [facility]   Culture     Final   Value: NO GROWTH     Performed at [facility]   Report Status 05/25/2013 FINAL   Final     Studies: Dg Chest 2 View  05/24/2013   CLINICAL DATA:  Fall, altered mental status  EXAM: CHEST  2 VIEW  COMPARISON:  Prior chest x-ray 12/29/2009  FINDINGS: Interval development of patchy left lower lobe opacity which may reflect atelectasis or infiltrate. Stable mild cardiomegaly. Atherosclerotic calcifications noted in the transverse aorta. Stable background changes of mild chronic interstitial prominence and bronchitic changes. No pneumothorax or large effusion. No acute osseous abnormality.  IMPRESSION: 1. Patchy left lower lobe  opacity may reflect atelectasis or infiltrate. 2. Stable cardiomegaly. 3. Aortic atherosclerosis.   Electronically Signed   By: Malachy Moan M.D.   On: 05/24/2013 15:18   Ct Head Wo Contrast  05/24/2013   CLINICAL DATA:  Status post fall.  Altered mental status.  EXAM: CT HEAD WITHOUT CONTRAST  TECHNIQUE: Contiguous axial images were obtained from the base of the skull through the vertex without intravenous contrast.  COMPARISON:  Head CT scan 02/07/2013.  FINDINGS: There is atrophy and chronic microvascular ischemic change. Remote left basal ganglia infarct is identified. No evidence of acute infarction, hemorrhage, mass lesion, mass effect, midline shift or abnormal extra-axial fluid collection. Small right mastoid effusion is noted. Calvarium intact.  IMPRESSION: No acute abnormality.  Atrophy and chronic microvascular ischemic change.   Electronically Signed   By: Drusilla Kanner M.D.   On: 05/24/2013 15:05   Dg Knee Complete 4 Views Right  05/25/2013   CLINICAL DATA:  Fall pain  EXAM: RIGHT KNEE - COMPLETE 4+ VIEW  COMPARISON:  None.  FINDINGS: Calcification of the lateral meniscus. Mild lateral compartment osteophyte formation. No significant joint space narrowing. Femoral artery calcification. There is a joint effusion, small to moderate. No fracture or dislocation.  IMPRESSION: Joint effusion.  No acute osseous abnormalities.   Electronically Signed   By: Esperanza Heir M.D.   On: 05/25/2013 16:08    Scheduled Meds: . aspirin  81 mg Oral Daily  . azithromycin  500 mg Oral Q24H  . cefTRIAXone (ROCEPHIN)  IV  1 g Intravenous Q24H  . donepezil  10 mg Oral QHS  . heparin  5,000 Units Subcutaneous 3 times per day  . Memantine HCl ER  1 capsule Oral Daily  . metoprolol succinate  25 mg Oral Daily  . mirtazapine  30 mg Oral QHS  . multivitamin-lutein  2 capsule Oral Daily  . simvastatin  20 mg Oral Daily  . sodium chloride  3 mL Intravenous Q12H  . vitamin C  1,000 mg Oral Daily  .  vitamin E  100 Units Oral Daily   Continuous Infusions:    Active Problems:   Mild dementia   Neuropathy   Memory loss   Fall   CAP (community acquired pneumonia)    Time spent: 10    [facility], Bethania Schlotzhauer  [facility] Pager [phone]. If 7PM-7AM, please contact night-coverage at [email or web address], password [facility] 05/26/2013, 8:45 AM  LOS: 2 days

## 2013-05-26 NOTE — ED Provider Notes (Signed)
I saw and evaluated the patient, reviewed the resident's note and I agree with the findings and plan.   Patient with fall. Possible pneumonia on x-ray. 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 He may have had a syncopal episode.  Juliet RudeNathan R. Rubin PayorPickering, MD 05/26/13 [phone]

## 2013-05-27 DIAGNOSIS — F0391 Unspecified dementia with behavioral disturbance: Secondary | ICD-10-CM

## 2013-05-27 DIAGNOSIS — F03918 Unspecified dementia, unspecified severity, with other behavioral disturbance: Secondary | ICD-10-CM

## 2013-05-27 MED ORDER — LEVOFLOXACIN 750 MG PO TABS
750.0000 mg | ORAL_TABLET | Freq: Every day | ORAL | Status: DC
  Administered 2013-05-27 – 2013-05-29 (×3): 750 mg via ORAL
  Filled 2013-05-27 (×3): qty 1

## 2013-05-27 NOTE — Progress Notes (Signed)
PROGRESS NOTE  [name] ZOX:[id number] DOB: 09/22/1924 DOA: 05/24/2013 PCP: Joycelyn Schmid, MD  Assessment/Plan: CAP -  -IV Rocephin and azithromycin- change to PO for d/c in AM -oxygen and nebulizer treatments as needed.   Advanced dementia. At risk for delirium, continue home medications, fall precautions, feeding assistance. -patient more oriented today   Hypertension. Continue home dose beta blocker.   Dyslipidemia continue statin at home dose.   Recently diagnosed PAD. Will add aspirin, continue statin for secondary prevention.   Fall at independent living. Likely due to generalized weakness from #1 above, -right knee x ray shows effusion but no fracture -PRN pain meds  Increased CPK -IVF -decreasing   Code Status: DNR Family Communication: son/daughter in law on phone- will be in hospital today Disposition Plan:  SNF on Monday   Consultants:    Procedures:    Antibiotics:    HPI/Subjective: Not oriented to place   Objective: Filed Vitals:   05/27/13 0530  BP: 129/72  Pulse: 80  Temp: 97.8 F (36.6 C)  Resp: 20    Intake/Output Summary (Last 24 hours) at 05/27/13 0841 Last data filed at 05/26/13 2134  Gross per 24 hour  Intake    533 ml  Output    175 ml  Net    358 ml   Filed Weights   05/24/13 2057 05/25/13 2041 05/26/13 2049  Weight: 56.6 kg (124 lb 12.5 oz) 57.5 kg (126 lb 12.2 oz) 55.6 kg (122 lb 9.2 oz)    Exam:   General:  Pleasant/cooperative  Cardiovascular: rrr  Respiratory: clear, no wheezing  Abdomen: +BS, soft  Musculoskeletal: moves all 4 ext  Data Reviewed: Basic Metabolic Panel:  Recent Labs Lab 05/24/13 1445 05/24/13 1656 05/24/13 1900 05/25/13 0551 05/26/13 0330  NA 143 142  --  138 139  K 4.2 3.7  --  3.7 3.5*  CL 103 100  --  104 102  CO2 28  --   --  26 23  GLUCOSE 109* 99  --  94 98  BUN 10 8  --  10 8  CREATININE 0.53 0.60 0.48* 0.57 0.48*  CALCIUM 9.0  --   --  8.2* 8.4    Liver Function Tests:  Recent Labs Lab 05/24/13 1445  AST 38*  ALT 22  ALKPHOS 82  BILITOT 0.8  PROT 6.5  ALBUMIN 3.4*   No results found for this basename: LIPASE, AMYLASE,  in the last 168 hours No results found for this basename: AMMONIA,  in the last 168 hours CBC:  Recent Labs Lab 05/24/13 1445 05/24/13 1656 05/24/13 1900 05/25/13 0551 05/26/13 0330  WBC 12.8*  --  10.7* 7.8 11.9*  HGB 14.2 16.3* 13.8 12.4 12.9  HCT 41.5 48.0* 40.2 36.6 37.7  MCV 92.2  --  92.6 93.4 93.1  PLT 237  --  246 214 219   Cardiac Enzymes:  Recent Labs Lab 05/24/13 1445 05/24/13 1530 05/26/13 0330  CKTOTAL  --  912* 275*  TROPONINI <0.30  --   --    BNP (last 3 results) No results found for this basename: PROBNP,  in the last 8760 hours CBG: No results found for this basename: GLUCAP,  in the last 168 hours  Recent Results (from the past 240 hour(s))  URINE CULTURE     Status: None   Collection Time    05/24/13  4:43 PM      Result Value Ref Range Status   Specimen Description URINE, CATHETERIZED  Final   Special Requests NONE   Final   Culture  Setup Time     Final   Value: 05/24/2013 19:00     Performed at [facility]   [place] Count     Final   Value: NO GROWTH     Performed at [facility]   Culture     Final   Value: NO GROWTH     Performed at [facility]   Report Status 05/25/2013 FINAL   Final  CULTURE, BLOOD (ROUTINE X 2)     Status: None   Collection Time    05/24/13  7:15 PM      Result Value Ref Range Status   Specimen Description BLOOD UPPER RIGHT ARM   Final   Special Requests     Final   Value: BOTTLES DRAWN AEROBIC AND ANAEROBIC BLUE 10 CC RED 6 CC   Culture  Setup Time     Final   Value: 05/25/2013 00:41     Performed at [facility]   Culture     Final   Value:        BLOOD CULTURE RECEIVED NO GROWTH TO DATE CULTURE WILL BE HELD FOR 5 DAYS BEFORE ISSUING A FINAL NEGATIVE REPORT     Performed at [facility]   Report Status PENDING   Incomplete  CULTURE, BLOOD (ROUTINE X 2)     Status: None   Collection Time    05/24/13  7:22 PM      Result Value Ref Range Status   Specimen Description BLOOD LOWER LEFT HAND   Final   Special Requests BOTTLES DRAWN AEROBIC ONLY 10 CC   Final   Culture  Setup Time     Final   Value: 05/25/2013 00:41     Performed at [facility]   Culture     Final   Value:        BLOOD CULTURE RECEIVED NO GROWTH TO DATE CULTURE WILL BE HELD FOR 5 DAYS BEFORE ISSUING A FINAL NEGATIVE REPORT     Performed at [facility]   Report Status PENDING   Incomplete     Studies: Dg Knee Complete 4 Views Right  05/25/2013   CLINICAL DATA:  Fall pain  EXAM: RIGHT KNEE - COMPLETE 4+ VIEW  COMPARISON:  None.  FINDINGS: Calcification of the lateral meniscus. Mild lateral compartment osteophyte formation. No significant joint space narrowing. Femoral artery calcification. There is a joint effusion, small to moderate. No fracture or dislocation.  IMPRESSION: Joint effusion.  No acute osseous abnormalities.   Electronically Signed   By: Esperanza Heiraymond  Rubner M.D.   On: 05/25/2013 16:08    Scheduled Meds: . aspirin  81 mg Oral Daily  . azithromycin  500 mg Oral Q24H  . cefTRIAXone (ROCEPHIN)  IV  1 g Intravenous Q24H  . donepezil  10 mg Oral QHS  . heparin  5,000 Units Subcutaneous 3 times per day  . Memantine HCl ER  1 capsule Oral Daily  . metoprolol succinate  25 mg Oral Daily  . mirtazapine  30 mg Oral QHS  . multivitamin-lutein  2 capsule Oral Daily  . simvastatin  20 mg Oral Daily  . sodium chloride  3 mL Intravenous Q12H  . vitamin C  1,000 mg Oral Daily  . vitamin E  100 Units Oral Daily   Continuous Infusions:    Active Problems:   Mild dementia   Neuropathy  Memory loss   Fall   CAP (community acquired pneumonia)    Time spent: 25    Marlin Canary  [facility] Pager [phone]. If 7PM-7AM, please contact night-coverage at  [email or web address], password [facility] 05/27/2013, 8:41 AM  LOS: 3 days

## 2013-05-27 NOTE — Progress Notes (Addendum)
Clinical Social Work Department CLINICAL SOCIAL WORK PLACEMENT NOTE 05/27/2013  Patient:  Peggy SheenCANEPA,Cassiopeia B  Account Number:  [id number] Admit date:  05/24/2013  Clinical Social Worker:  Maryclare LabradorJULIE ANDERSON, Theresia MajorsLCSWA  Date/time:  05/27/2013 08:31 AM  Clinical Social Work is seeking post-discharge placement for this patient at the following level of care:   SKILLED NURSING   (*CSW will update this form in Epic as items are completed)   N/A--weekend CSW received permission from pt to send clinicals [place] on 05/26/13  [facility]family provided with [facility] of Clinical [facility] Work's list of facilities offering this level of care within the geographic area requested by the patient (or if unable, by the patient's family).  05/26/2013  Patient/family informed of their freedom to choose among providers that offer the needed level of care, that participate in Medicare, Medicaid or managed care program needed by the patient, have an available bed and are willing to accept the patient.  N/A--clinicals not sent to this SNF  Patient/family informed of [facility]' ownership interest in [facility], as well as of the fact that they are under no obligation to receive care at this facility.  PASARR submitted to EDS on 05/27/2013 PASARR number received from EDS on 05/27/2013  FL2 transmitted to all facilities in geographic area requested by pt/family on  05/27/2013 FL2 transmitted to all facilities within larger geographic area on   Patient informed that his/her managed care company has contracts with or will negotiate with  certain facilities, including the following:     Patient/family informed of bed offers received: 05/28/13   [facility] and patient chooses bed at    [facility]13   Patient to be transferred to facility by ambulance  The following physician  request were entered in Epic:  Additional Comments: 05/28/13 - CSW informed by admissions director, Genia HaroldJanie Holden that due to holiday, they are unable to verify [employer] benefits and patient unable to discharge today. Family completing paperwork today (2/16) and patient can discharge on Tuesday, 2/17. Family not at bedside so will be informed by admissions director when they arrive to complete paperwork at facility. CSW visited room and informed patient. She is alert but was somewhat confused.  Alvina ChouV. Naira Standiford, LCSW    Maryclare LabradorJulie Anderson, MSW, [facility]CSWA Clinical Social Worker [phone]

## 2013-05-28 ENCOUNTER — Other Ambulatory Visit: Payer: Self-pay | Admitting: *Deleted

## 2013-05-28 DIAGNOSIS — I739 Peripheral vascular disease, unspecified: Secondary | ICD-10-CM

## 2013-05-28 LAB — LEGIONELLA ANTIGEN, URINE: Legionella Antigen, Urine: NEGATIVE

## 2013-05-28 MED ORDER — LEVOFLOXACIN 750 MG PO TABS: 750.0000 mg | ORAL_TABLET | Freq: Every day | ORAL | Status: AC

## 2013-05-28 MED ORDER — GUAIFENESIN-DM 100-10 MG/5ML PO SYRP: 5.0000 mL | ORAL_SOLUTION | ORAL | Status: AC | PRN

## 2013-05-28 NOTE — Discharge Summary (Signed)
Physician Discharge Summary  [name] ZOX:[id number] DOB: 1924-10-27 DOA: 05/24/2013  PCP: Joycelyn Schmid, MD  Admit date: 05/24/2013 Discharge date: 05/28/2013  Time spent: 35 minutes  Recommendations for Outpatient Follow-up:  1. Cbc, bmp 1 week 2. levaquin until 2/19  Discharge Diagnoses:  Active Problems:   Mild dementia   Neuropathy   Memory loss   Fall   CAP (community acquired pneumonia) increased CK  Discharge Condition: improved  Diet recommendation: regular  Filed Weights   05/25/13 2041 05/26/13 2049 05/27/13 2101  Weight: 57.5 kg (126 lb 12.2 oz) 55.6 kg (122 lb 9.2 oz) 54.8 kg (120 lb 13 oz)    History of present illness:  [name] is a 80 y.o. female, lives in the independent living with history of advanced dementia, dyslipidemia, hypertension, neuropathy, mitral valve prolapse, recently diagnosed PAD found on outpatient arterial ultrasound done on the 10th of this month, was brought in by EMS she was found lying on the floor at the independent living facility covered in feces, she was brought in the ER where her workup was consistent with community-acquired pneumonia, patient has advanced dementia and she denies ever falling or being in any discomfort whatsoever, she denies any fever cough or shortness of breath, denies any body aches. Denies of any joint pains or aches. No hip pain. She still thinks she is at her house and she is fine. Her only complaint is that she feels hungry and wants food.   Hospital Course:  CAP -  -IV Rocephin and azithromycin- change to PO for d/c in AM  -oxygen and nebulizer treatments as needed.   Advanced dementia. At risk for delirium, continue home medications, fall precautions, feeding assistance.  -patient more oriented today with family at bedside  Hypertension. Continue home dose beta blocker.   Dyslipidemia continue statin at home dose.   Recently diagnosed PAD. Will add aspirin, continue statin for  secondary prevention.   Fall at independent living. Likely due to generalized weakness from #1 above,  -right knee x ray shows effusion but no fracture    Increased CPK  -IVF  -decreasing      Procedures:  none  Consultations:  none  Discharge Exam: Filed Vitals:   05/28/13 0910  BP: 119/43  Pulse: 104  Temp: 98.1 F (36.7 C)  Resp: 20    General: pleasant/cooperative Cardiovascular: rrr Respiratory: clear anterior  Discharge Instructions      Discharge Orders   Future Appointments Provider Department Dept Phone   05/30/2013 11:30 AM Suanne Marker, MD [facility] [phone]   06/12/2013 10:30 AM [facility] [address] [phone]   06/12/2013 11:30 AM Pryor Ochoa, MD [facility] [phone]   Future Orders Complete By Expires   Diet general  As directed    Discharge instructions  As directed    Comments:     CBC. BMP 1 week levaquin until 2/19   Increase activity slowly  As directed        Medication List         donepezil 10 MG tablet  Commonly known as:  ARICEPT  Take 1 tablet (10 mg total) by mouth at bedtime.     guaiFENesin-dextromethorphan 100-10 MG/5ML syrup  Commonly known as:  ROBITUSSIN DM  Take 5 mLs by mouth every 4 (four) hours as needed for cough.     levofloxacin 750 MG tablet  Commonly known as:  LEVAQUIN  Take 1 tablet (750  mg total) by mouth daily.     meclizine 12.5 MG tablet  Commonly known as:  ANTIVERT  Take 12.5 mg by mouth 3 (three) times daily as needed for dizziness.     Memantine HCl ER 28 MG Cp24  Commonly known as:  NAMENDA XR  Take 28 mg by mouth daily.     metoprolol succinate 25 MG 24 hr tablet  Commonly known as:  TOPROL-XL  Take 25 mg by mouth daily.     mirtazapine 15 MG tablet  Commonly known as:  REMERON  Take 30 mg by mouth at bedtime.     OCUVITE EYE HEALTH FORMULA Caps  Take 2 capsules by mouth 2 (two) times  daily.     simvastatin 20 MG tablet  Commonly known as:  ZOCOR  Take 20 mg by mouth daily.     vitamin C 1000 MG tablet  Take 1,000 mg by mouth daily.     vitamin E 100 UNIT capsule  Take 100 Units by mouth daily.       No Known Allergies Follow-up Information   Follow up with PENUMALLI,VIKRAM, MD In 1 week. (CBC, BMP at that time)    Specialties:  Neurology, Radiology   Contact information:   [address] [place] [address] [phone]        The results of significant diagnostics from this hospitalization (including imaging, microbiology, ancillary and laboratory) are listed below for reference.    Significant Diagnostic Studies: Dg Chest 2 View  05/24/2013   CLINICAL DATA:  Fall, altered mental status  EXAM: CHEST  2 VIEW  COMPARISON:  Prior chest x-ray 12/29/2009  FINDINGS: Interval development of patchy left lower lobe opacity which may reflect atelectasis or infiltrate. Stable mild cardiomegaly. Atherosclerotic calcifications noted in the transverse aorta. Stable background changes of mild chronic interstitial prominence and bronchitic changes. No pneumothorax or large effusion. No acute osseous abnormality.  IMPRESSION: 1. Patchy left lower lobe opacity may reflect atelectasis or infiltrate. 2. Stable cardiomegaly. 3. Aortic atherosclerosis.   Electronically Signed   By: Malachy MoanHeath  McCullough M.D.   On: 05/24/2013 15:18   Ct Head Wo Contrast  05/24/2013   CLINICAL DATA:  Status post fall.  Altered mental status.  EXAM: CT HEAD WITHOUT CONTRAST  TECHNIQUE: Contiguous axial images were obtained from the base of the skull through the vertex without intravenous contrast.  COMPARISON:  Head CT scan 02/07/2013.  FINDINGS: There is atrophy and chronic microvascular ischemic change. Remote left basal ganglia infarct is identified. No evidence of acute infarction, hemorrhage, mass lesion, mass effect, midline shift or abnormal extra-axial fluid collection. Small right  mastoid effusion is noted. Calvarium intact.  IMPRESSION: No acute abnormality.  Atrophy and chronic microvascular ischemic change.   Electronically Signed   By: Drusilla Kannerhomas  Dalessio M.D.   On: 05/24/2013 15:05   [place] Arterial Seg Multiple  05/22/2013   CLINICAL DATA:  Right great toe pain, ulceration, decreased pulses  EXAM: NONINVASIVE PHYSIOLOGIC VASCULAR STUDY OF BILATERAL LOWER EXTREMITIES  TECHNIQUE: Evaluation of both lower extremities was performed at rest, including calculation of ankle-brachial indices, multiple segmental pressure evaluation, segmental Doppler and segmental pulse volume recording.  COMPARISON:  None.  FINDINGS: Right ABI:  0.6  Left ABI:  0.6  Right Lower Extremity: Focal transition from normal triphasic pulses in the proximal femoral artery to an abnormal monophasic pulse in the popliteal arteries suggesting occlusive SFA disease. The posterior tibial and dorsalis pedis vessels remain patent with monophasic pulses.  Focal attenuation of the pulse volume recording from the upper to lower thigh are concordant.  Left Lower Extremity: Focal transition from normal triphasic pulses in the proximal femoral artery to an abnormal monophasic pulse in the popliteal arteries suggesting occlusive SFA disease. The posterior tibial and dorsalis pedis vessels remain patent with monophasic pulses. Focal attenuation of the pulse volume recording from the upper to lower thigh are concordant.  IMPRESSION: 1. Relatively symmetric moderately severe bilateral occlusive peripheral arterial disease. 2. The level of the disease appears to be outflow (superficial femoral artery) bilaterally. Consider further evaluation with CTA or MRA with runoff to fully evaluate the extent of the peripheral arterial disease. Additionally, consultation with a peripheral endovascular specialist such as Interventional Radiology, or Vascular Surgery may be beneficial.  Signed,  Sterling Big, MD  [facility]  [facility] [phone]   Electronically Signed   By: Malachy Moan M.D.   On: 05/22/2013 15:57   Dg Knee Complete 4 Views Right  05/25/2013   CLINICAL DATA:  Fall pain  EXAM: RIGHT KNEE - COMPLETE 4+ VIEW  COMPARISON:  None.  FINDINGS: Calcification of the lateral meniscus. Mild lateral compartment osteophyte formation. No significant joint space narrowing. Femoral artery calcification. There is a joint effusion, small to moderate. No fracture or dislocation.  IMPRESSION: Joint effusion.  No acute osseous abnormalities.   Electronically Signed   By: Esperanza Heir M.D.   On: 05/25/2013 16:08    Microbiology: Recent Results (from the past 240 hour(s))  URINE CULTURE     Status: None   Collection Time    05/24/13  4:43 PM      Result Value Ref Range Status   Specimen Description URINE, CATHETERIZED   Final   Special Requests NONE   Final   Culture  Setup Time     Final   Value: 05/24/2013 19:00     Performed at [facility] Count     Final   Value: NO GROWTH     Performed at [facility]   Culture     Final   Value: NO GROWTH     Performed at [facility]   Report Status 05/25/2013 FINAL   Final  CULTURE, BLOOD (ROUTINE X 2)     Status: None   Collection Time    05/24/13  7:15 PM      Result Value Ref Range Status   Specimen Description BLOOD UPPER RIGHT ARM   Final   Special Requests     Final   Value: BOTTLES DRAWN AEROBIC AND ANAEROBIC BLUE 10 CC RED 6 CC   Culture  Setup Time     Final   Value: 05/25/2013 00:41     Performed at [facility]   Culture     Final   Value:        BLOOD CULTURE RECEIVED NO GROWTH TO DATE CULTURE WILL BE HELD FOR 5 DAYS BEFORE ISSUING A FINAL NEGATIVE REPORT     Performed at [facility]   Report Status PENDING   Incomplete  CULTURE, BLOOD (ROUTINE X 2)     Status: None   Collection Time    05/24/13  7:22 PM      Result Value Ref Range Status   Specimen Description  BLOOD LOWER LEFT HAND   Final   Special Requests BOTTLES DRAWN AEROBIC ONLY 10 CC   Final   Culture  Setup Time     Final   Value: 05/25/2013 00:41     Performed at [facility]   Culture     Final   Value:        BLOOD CULTURE RECEIVED NO GROWTH TO DATE CULTURE WILL BE HELD FOR 5 DAYS BEFORE ISSUING A FINAL NEGATIVE REPORT     Performed at [facility]   Report Status PENDING   Incomplete     Labs: Basic Metabolic Panel:  Recent Labs Lab 05/24/13 1445 05/24/13 1656 05/24/13 1900 05/25/13 0551 05/26/13 0330  NA 143 142  --  138 139  K 4.2 3.7  --  3.7 3.5*  CL 103 100  --  104 102  CO2 28  --   --  26 23  GLUCOSE 109* 99  --  94 98  BUN 10 8  --  10 8  CREATININE 0.53 0.60 0.48* 0.57 0.48*  CALCIUM 9.0  --   --  8.2* 8.4   Liver Function Tests:  Recent Labs Lab 05/24/13 1445  AST 38*  ALT 22  ALKPHOS 82  BILITOT 0.8  PROT 6.5  ALBUMIN 3.4*   No results found for this basename: LIPASE, AMYLASE,  in the last 168 hours No results found for this basename: AMMONIA,  in the last 168 hours CBC:  Recent Labs Lab 05/24/13 1445 05/24/13 1656 05/24/13 1900 05/25/13 0551 05/26/13 0330  WBC 12.8*  --  10.7* 7.8 11.9*  HGB 14.2 16.3* 13.8 12.4 12.9  HCT 41.5 48.0* 40.2 36.6 37.7  MCV 92.2  --  92.6 93.4 93.1  PLT 237  --  246 214 219   Cardiac Enzymes:  Recent Labs Lab 05/24/13 1445 05/24/13 1530 05/26/13 0330  CKTOTAL  --  912* 275*  TROPONINI <0.30  --   --    BNP: BNP (last 3 results) No results found for this basename: PROBNP,  in the last 8760 hours CBG: No results found for this basename: GLUCAP,  in the last 168 hours     Signed:  Marlin Canary  [facility] 05/28/2013, 9:33 AM

## 2013-05-28 NOTE — Progress Notes (Signed)
Physical Therapy Treatment Patient Details Name: [name] Code MRN: [id number] DOB: March 17, 1925 Today's Date: 05/28/2013 Time: 1208-1219 PT Time Calculation (min): 11 min  PT Assessment / Plan / Recommendation  History of Present Illness [name]  is a 80 y.o. female, lives in the independent living with history of advanced dementia, dyslipidemia, hypertension, neuropathy, mitral valve prolapse, recently diagnosed PAD, was brought in by EMS she was found lying on the floor at the independent living facility covered in feces, she was brought in the ER where her workup was consistent with community-acquired pneumonia.   PT Comments   Patient agitated as PT entered room.  States "they lost my shoe".  Patient disoriented, stating she is visiting someone, but not sure who.  Was able to calm patient.  She did agree to perform exercises.  Agree with need for SNF.  Follow Up Recommendations  SNF     Does the patient have the potential to tolerate intense rehabilitation     Barriers to Discharge        Equipment Recommendations  None recommended by PT    Recommendations for Other Services    Frequency Min 3X/week   Progress towards PT Goals Progress towards PT goals: Progressing toward goals  Plan Current plan remains appropriate    Precautions / Restrictions Precautions Precautions: Fall Restrictions Weight Bearing Restrictions: No   Pertinent Vitals/Pain     Mobility       Exercises General Exercises - Upper Extremity Shoulder Flexion: AAROM;Both;5 reps;Supine Shoulder Horizontal ABduction: AAROM;Both;10 reps;Supine Shoulder Horizontal ADduction: AAROM;Both;10 reps;Supine General Exercises - Lower Extremity Ankle Circles/Pumps: AROM;Both;10 reps;Supine Short Arc Quad: AROM;Both;10 reps;Supine Heel Slides: AROM;Both;10 reps;Supine Hip ABduction/ADduction: AROM;Both;10 reps;Supine Straight Leg Raises: AROM;Both;10 reps;Supine     PT Goals (current goals can now be  found in the care plan section)    Visit Information  Last PT Received On: 05/28/13 Assistance Needed: +2 (for safety for OOB) History of Present Illness: [name]  is a 80 y.o. female, lives in the independent living with history of advanced dementia, dyslipidemia, hypertension, neuropathy, mitral valve prolapse, recently diagnosed PAD, was brought in by EMS she was found lying on the floor at the independent living facility covered in feces, she was brought in the ER where her workup was consistent with community-acquired pneumonia.    Subjective Data  Subjective: "They've lost my shoe and I don't know where they went"   Cognition  Cognition Arousal/Alertness: Awake/alert Behavior During Therapy: Anxious;Agitated Overall Cognitive Status: No family/caregiver present to determine baseline cognitive functioning (Disoriented - states she is a visitor for someone.) Memory: Decreased short-term memory    Balance     End of Session PT - End of Session Activity Tolerance: Patient limited by fatigue;Treatment limited secondary to agitation Patient left: in bed;with call bell/phone within reach;with bed alarm set   GP     Vena AustriaDavis, Chirstina Haan H 05/28/2013, 1:17 PM Durenda HurtSusan H. Renaldo Fiddleravis, PT, [facility] Pager [phone]

## 2013-05-29 NOTE — Progress Notes (Signed)
Patient's discharge was held yesterday.  Plan for discharge today once insurance improved  Marlin CanaryJessica Raynee Mccasland DO

## 2013-05-29 NOTE — Progress Notes (Signed)
[facility]  Patient notified of transfer.   Patient educated on discharge medications.  Patient belonging gathered.  IV removed.  Report called to nurse at [facility].  Patient transferred via stretcher with EMS.

## 2013-05-29 NOTE — Discharge Summary (Signed)
Physician Discharge Summary  [name] ZOX:[id number] DOB: Jul 10, 1924 DOA: 05/24/2013  PCP: Joycelyn Schmid, MD  Admit date: 05/24/2013 Discharge date: 05/29/2013  Time spent: 35 minutes  Recommendations for Outpatient Follow-up:  1. Cbc, bmp 1 week 2. levaquin until 2/19  Discharge Diagnoses:  Active Problems:   Mild dementia   Neuropathy   Memory loss   Fall   CAP (community acquired pneumonia) increased CK  Discharge Condition: improved  Diet recommendation: regular  Filed Weights   05/26/13 2049 05/27/13 2101 05/28/13 2101  Weight: 55.6 kg (122 lb 9.2 oz) 54.8 kg (120 lb 13 oz) 54.8 kg (120 lb 13 oz)    History of present illness:  [name] is a 80 y.o. female, lives in the independent living with history of advanced dementia, dyslipidemia, hypertension, neuropathy, mitral valve prolapse, recently diagnosed PAD found on outpatient arterial ultrasound done on the 10th of this month, was brought in by EMS she was found lying on the floor at the independent living facility covered in feces, she was brought in the ER where her workup was consistent with community-acquired pneumonia, patient has advanced dementia and she denies ever falling or being in any discomfort whatsoever, she denies any fever cough or shortness of breath, denies any body aches. Denies of any joint pains or aches. No hip pain. She still thinks she is at her house and she is fine. Her only complaint is that she feels hungry and wants food.   Hospital Course:  CAP -  -IV Rocephin and azithromycin- change to PO for d/c in AM  -oxygen and nebulizer treatments as needed.   Advanced dementia. At risk for delirium, continue home medications, fall precautions, feeding assistance.  -patient more oriented today with family at bedside  Hypertension. Continue home dose beta blocker.   Dyslipidemia continue statin at home dose.   Recently diagnosed PAD. Will add aspirin, continue statin for secondary  prevention.   Fall at independent living. Likely due to generalized weakness from #1 above,  -right knee x ray shows effusion but no fracture    Increased CPK  -IVF  -decreasing      Procedures:  none  Consultations:  none  Discharge Exam: Filed Vitals:   05/29/13 1300  BP: 131/56  Pulse: 78  Temp: 98 F (36.7 C)  Resp: 18    General: pleasant/cooperative Cardiovascular: rrr Respiratory: clear anterior  Discharge Instructions      Discharge Orders   Future Appointments Provider Department Dept Phone   05/30/2013 11:30 AM Suanne Marker, MD [facility] [phone]   06/12/2013 10:30 AM [facility] [address] [phone]   06/12/2013 11:30 AM Pryor Ochoa, MD [facility] [phone]   Future Orders Complete By Expires   Diet general  As directed    Discharge instructions  As directed    Comments:     CBC. BMP 1 week levaquin until 2/19   Increase activity slowly  As directed        Medication List         donepezil 10 MG tablet  Commonly known as:  ARICEPT  Take 1 tablet (10 mg total) by mouth at bedtime.     guaiFENesin-dextromethorphan 100-10 MG/5ML syrup  Commonly known as:  ROBITUSSIN DM  Take 5 mLs by mouth every 4 (four) hours as needed for cough.     levofloxacin 750 MG tablet  Commonly known as:  LEVAQUIN  Take 1 tablet (750  mg total) by mouth daily.     meclizine 12.5 MG tablet  Commonly known as:  ANTIVERT  Take 12.5 mg by mouth 3 (three) times daily as needed for dizziness.     Memantine HCl ER 28 MG Cp24  Commonly known as:  NAMENDA XR  Take 28 mg by mouth daily.     metoprolol succinate 25 MG 24 hr tablet  Commonly known as:  TOPROL-XL  Take 25 mg by mouth daily.     mirtazapine 15 MG tablet  Commonly known as:  REMERON  Take 30 mg by mouth at bedtime.     OCUVITE EYE HEALTH FORMULA Caps  Take 2 capsules by mouth 2 (two) times daily.      simvastatin 20 MG tablet  Commonly known as:  ZOCOR  Take 20 mg by mouth daily.     vitamin C 1000 MG tablet  Take 1,000 mg by mouth daily.     vitamin E 100 UNIT capsule  Take 100 Units by mouth daily.       No Known Allergies Follow-up Information   Follow up with PENUMALLI,VIKRAM, MD In 1 week. (CBC, BMP at that time)    Specialties:  Neurology, Radiology   Contact information:   [address] [place] [address] [phone]        The results of significant diagnostics from this hospitalization (including imaging, microbiology, ancillary and laboratory) are listed below for reference.    Significant Diagnostic Studies: Dg Chest 2 View  05/24/2013   CLINICAL DATA:  Fall, altered mental status  EXAM: CHEST  2 VIEW  COMPARISON:  Prior chest x-ray 12/29/2009  FINDINGS: Interval development of patchy left lower lobe opacity which may reflect atelectasis or infiltrate. Stable mild cardiomegaly. Atherosclerotic calcifications noted in the transverse aorta. Stable background changes of mild chronic interstitial prominence and bronchitic changes. No pneumothorax or large effusion. No acute osseous abnormality.  IMPRESSION: 1. Patchy left lower lobe opacity may reflect atelectasis or infiltrate. 2. Stable cardiomegaly. 3. Aortic atherosclerosis.   Electronically Signed   By: Malachy MoanHeath  McCullough M.D.   On: 05/24/2013 15:18   Ct Head Wo Contrast  05/24/2013   CLINICAL DATA:  Status post fall.  Altered mental status.  EXAM: CT HEAD WITHOUT CONTRAST  TECHNIQUE: Contiguous axial images were obtained from the base of the skull through the vertex without intravenous contrast.  COMPARISON:  Head CT scan 02/07/2013.  FINDINGS: There is atrophy and chronic microvascular ischemic change. Remote left basal ganglia infarct is identified. No evidence of acute infarction, hemorrhage, mass lesion, mass effect, midline shift or abnormal extra-axial fluid collection. Small right mastoid effusion  is noted. Calvarium intact.  IMPRESSION: No acute abnormality.  Atrophy and chronic microvascular ischemic change.   Electronically Signed   By: Drusilla Kannerhomas  Dalessio M.D.   On: 05/24/2013 15:05   [place] Arterial Seg Multiple  05/22/2013   CLINICAL DATA:  Right great toe pain, ulceration, decreased pulses  EXAM: NONINVASIVE PHYSIOLOGIC VASCULAR STUDY OF BILATERAL LOWER EXTREMITIES  TECHNIQUE: Evaluation of both lower extremities was performed at rest, including calculation of ankle-brachial indices, multiple segmental pressure evaluation, segmental Doppler and segmental pulse volume recording.  COMPARISON:  None.  FINDINGS: Right ABI:  0.6  Left ABI:  0.6  Right Lower Extremity: Focal transition from normal triphasic pulses in the proximal femoral artery to an abnormal monophasic pulse in the popliteal arteries suggesting occlusive SFA disease. The posterior tibial and dorsalis pedis vessels remain patent with monophasic pulses.  Focal attenuation of the pulse volume recording from the upper to lower thigh are concordant.  Left Lower Extremity: Focal transition from normal triphasic pulses in the proximal femoral artery to an abnormal monophasic pulse in the popliteal arteries suggesting occlusive SFA disease. The posterior tibial and dorsalis pedis vessels remain patent with monophasic pulses. Focal attenuation of the pulse volume recording from the upper to lower thigh are concordant.  IMPRESSION: 1. Relatively symmetric moderately severe bilateral occlusive peripheral arterial disease. 2. The level of the disease appears to be outflow (superficial femoral artery) bilaterally. Consider further evaluation with CTA or MRA with runoff to fully evaluate the extent of the peripheral arterial disease. Additionally, consultation with a peripheral endovascular specialist such as Interventional Radiology, or Vascular Surgery may be beneficial.  Signed,  Sterling Big, MD  [facility]   [facility] [phone]   Electronically Signed   By: Malachy Moan M.D.   On: 05/22/2013 15:57   Dg Knee Complete 4 Views Right  05/25/2013   CLINICAL DATA:  Fall pain  EXAM: RIGHT KNEE - COMPLETE 4+ VIEW  COMPARISON:  None.  FINDINGS: Calcification of the lateral meniscus. Mild lateral compartment osteophyte formation. No significant joint space narrowing. Femoral artery calcification. There is a joint effusion, small to moderate. No fracture or dislocation.  IMPRESSION: Joint effusion.  No acute osseous abnormalities.   Electronically Signed   By: Esperanza Heir M.D.   On: 05/25/2013 16:08    Microbiology: Recent Results (from the past 240 hour(s))  URINE CULTURE     Status: None   Collection Time    05/24/13  4:43 PM      Result Value Ref Range Status   Specimen Description URINE, CATHETERIZED   Final   Special Requests NONE   Final   Culture  Setup Time     Final   Value: 05/24/2013 19:00     Performed at [facility] Count     Final   Value: NO GROWTH     Performed at [facility]   Culture     Final   Value: NO GROWTH     Performed at [facility]   Report Status 05/25/2013 FINAL   Final  CULTURE, BLOOD (ROUTINE X 2)     Status: None   Collection Time    05/24/13  7:15 PM      Result Value Ref Range Status   Specimen Description BLOOD UPPER RIGHT ARM   Final   Special Requests     Final   Value: BOTTLES DRAWN AEROBIC AND ANAEROBIC BLUE 10 CC RED 6 CC   Culture  Setup Time     Final   Value: 05/25/2013 00:41     Performed at [facility]   Culture     Final   Value:        BLOOD CULTURE RECEIVED NO GROWTH TO DATE CULTURE WILL BE HELD FOR 5 DAYS BEFORE ISSUING A FINAL NEGATIVE REPORT     Performed at [facility]   Report Status PENDING   Incomplete  CULTURE, BLOOD (ROUTINE X 2)     Status: None   Collection Time    05/24/13  7:22 PM      Result Value Ref Range Status   Specimen Description BLOOD LOWER  LEFT HAND   Final   Special Requests BOTTLES DRAWN AEROBIC ONLY 10 CC   Final   Culture  Setup Time     Final   Value: 05/25/2013 00:41     Performed at [facility]   Culture     Final   Value:        BLOOD CULTURE RECEIVED NO GROWTH TO DATE CULTURE WILL BE HELD FOR 5 DAYS BEFORE ISSUING A FINAL NEGATIVE REPORT     Performed at [facility]   Report Status PENDING   Incomplete     Labs: Basic Metabolic Panel:  Recent Labs Lab 05/24/13 1445 05/24/13 1656 05/24/13 1900 05/25/13 0551 05/26/13 0330  NA 143 142  --  138 139  K 4.2 3.7  --  3.7 3.5*  CL 103 100  --  104 102  CO2 28  --   --  26 23  GLUCOSE 109* 99  --  94 98  BUN 10 8  --  10 8  CREATININE 0.53 0.60 0.48* 0.57 0.48*  CALCIUM 9.0  --   --  8.2* 8.4   Liver Function Tests:  Recent Labs Lab 05/24/13 1445  AST 38*  ALT 22  ALKPHOS 82  BILITOT 0.8  PROT 6.5  ALBUMIN 3.4*   No results found for this basename: LIPASE, AMYLASE,  in the last 168 hours No results found for this basename: AMMONIA,  in the last 168 hours CBC:  Recent Labs Lab 05/24/13 1445 05/24/13 1656 05/24/13 1900 05/25/13 0551 05/26/13 0330  WBC 12.8*  --  10.7* 7.8 11.9*  HGB 14.2 16.3* 13.8 12.4 12.9  HCT 41.5 48.0* 40.2 36.6 37.7  MCV 92.2  --  92.6 93.4 93.1  PLT 237  --  246 214 219   Cardiac Enzymes:  Recent Labs Lab 05/24/13 1445 05/24/13 1530 05/26/13 0330  CKTOTAL  --  912* 275*  TROPONINI <0.30  --   --    BNP: BNP (last 3 results) No results found for this basename: PROBNP,  in the last 8760 hours CBG: No results found for this basename: GLUCAP,  in the last 168 hours     Signed:  Benjamine Mola, Narjis Mira  [facility] 05/29/2013, 2:10 PM

## 2013-05-30 ENCOUNTER — Ambulatory Visit: Payer: MEDICARE | Admitting: Diagnostic Neuroimaging

## 2013-05-31 LAB — CULTURE, BLOOD (ROUTINE X 2)
CULTURE: NO GROWTH
Culture: NO GROWTH

## 2013-06-11 ENCOUNTER — Encounter: Payer: Self-pay | Admitting: Vascular Surgery

## 2013-06-12 ENCOUNTER — Encounter: Payer: Self-pay | Admitting: Vascular Surgery

## 2013-06-12 ENCOUNTER — Ambulatory Visit (HOSPITAL_COMMUNITY)
Admission: RE | Admit: 2013-06-12 | Discharge: 2013-06-12 | Disposition: A | Discharge status: Home / Self Care | Payer: MEDICARE | Attending: Vascular Surgery | Admitting: Vascular Surgery

## 2013-06-12 ENCOUNTER — Ambulatory Visit (INDEPENDENT_AMBULATORY_CARE_PROVIDER_SITE_OTHER): Payer: MEDICARE | Admitting: Vascular Surgery

## 2013-06-12 VITALS — BP 106/59 | HR 69 | Ht 64.0 in | Wt 115.0 lb

## 2013-06-12 DIAGNOSIS — I739 Peripheral vascular disease, unspecified: Secondary | ICD-10-CM

## 2013-06-12 NOTE — Progress Notes (Signed)
Subjective:     Patient ID: [name], female   DOB: 09-09-1924, 80 y.o.   MRN: [id number]  HPI this 80 year old female was referred by Dr.Jaralla for evaluation of circulation-right leg. Patient is accompanied by her son. She does have some degree of dementia. She denies any pain in the feet. She denies any history of nonhealing ulcers infection or gangrene. She has an abrasion in the right pretibial region and she does not know the cause of this. She is not able to ambulate is a generalized weakness. She is not clear on when her ability to angulate independently disappeared. She's is not complaining of anything specifically today. She does apparently have neuropathy in her right foot according to her son.  Past Medical History  Diagnosis Date  . Dyslipidemia   . Alopecia   . Arthritis   . Hereditary peripheral neuropathy(356.0)     neurology eval 3/12  . Hereditary neuropathy with pressure palsies (HNPP)   . PVC (premature ventricular contraction)     history  . Vertigo history  . Neuropathy     history  . Hyperlipidemia   . Hx of mitral valve prolapse     mild prolapse with mitral regurgitation    History  Substance Use Topics  . Smoking status: Former Smoker -- 1.00 packs/day for 8 years    Quit date: 07/01/1963  . Smokeless tobacco: Never Used  . Alcohol Use: 2.4 oz/week    4 Shots of liquor per week     Comment: short one before I go to bed 1-2oz    Family History  Problem Relation Age of Onset  . Other Father     heart valve infection  . Stroke Mother   . Diabetes Mother   . Heart disease Mother   . Hyperlipidemia Mother   . Hypertension Mother   . Heart disease Brother     No Known Allergies  Current outpatient prescriptions:donepezil (ARICEPT) 10 MG tablet, Take 1 tablet (10 mg total) by mouth at bedtime., Disp: 30 tablet, Rfl: 12;  meclizine (ANTIVERT) 12.5 MG tablet, Take 12.5 mg by mouth 3 (three) times daily as needed for dizziness., Disp: , Rfl: ;   Memantine HCl ER (NAMENDA XR) 28 MG CP24, Take 28 mg by mouth daily., Disp: 30 capsule, Rfl: 12;  mirtazapine (REMERON) 15 MG tablet, Take 30 mg by mouth at bedtime. , Disp: , Rfl:  simvastatin (ZOCOR) 20 MG tablet, Take 20 mg by mouth daily., Disp: , Rfl: ;  Ascorbic Acid (VITAMIN C) 1000 MG tablet, Take 1,000 mg by mouth daily., Disp: , Rfl: ;  guaiFENesin-dextromethorphan (ROBITUSSIN DM) 100-10 MG/5ML syrup, Take 5 mLs by mouth every 4 (four) hours as needed for cough., Disp: 118 mL, Rfl: 0;  levofloxacin (LEVAQUIN) 750 MG tablet, Take 1 tablet (750 mg total) by mouth daily., Disp: , Rfl:  metoprolol succinate (TOPROL-XL) 25 MG 24 hr tablet, Take 25 mg by mouth daily., Disp: , Rfl: ;  Multiple Vitamins-Minerals (OCUVITE EYE HEALTH FORMULA) CAPS, Take 2 capsules by mouth 2 (two) times daily.  , Disp: , Rfl: ;  vitamin E 100 UNIT capsule, Take 100 Units by mouth daily., Disp: , Rfl:   BP 106/59  Pulse 69  Ht 5\' 4"  (1.626 m)  Wt 115 lb (52.164 kg)  BMI 19.73 kg/m2  SpO2 99%  Body mass index is 19.73 kg/(m^2).           Review of Systems review of systems not felt to be  reliable. Does complain of chronic neuropathy in both feet, dizziness, generalized weakness, depression, other systems on remarkable in a complete review of systems other than dementia     Objective:   Physical Exam BP 106/59  Pulse 69  Ht 5\' 4"  (1.626 m)  Wt 115 lb (52.164 kg)  BMI 19.73 kg/m2  SpO2 99%  Gen.-alert and oriented x3 in no apparent distress-in a wheelchair-very frail in appearance-answers questions slowly  HEENT normal for age Lungs no rhonchi or wheezing Cardiovascular regular rhythm no murmurs carotid pulses 3+ palpable no bruits audible Abdomen soft nontender no palpable masses Musculoskeletal free of  major deformities Skin clear -no rashes-linear abrasion and right pretibial region about 4 cm in length right above tibia-not infected Neurologic normal Lower extremities 2+ femoral pulses  bilaterally. Absent popliteal and distal pulses. Both feet free of any infection gangrene or ulceration. Decreased sensation in both feet right greater than left.  Today I ordered lower extremity arterial Doppler exam which are reviewed and interpreted. Patient has diffuse superficial femoral occlusive disease in her tibial vessels are not well visualized. Recent ABIs were 0.6 on the right and 0.64 on the left-05/22/2013       Assessment:     Severe diffuse peripheral vascular occlusive disease both lower extremities with no history of rest pain, nonhealing ulcers, infection, gangrene. Patient with dementia and is not ambulatory  Do not think her vascular disease is the reason she is nonambulatory but rather generalized weakness    Plan:     No need for any further vascular evaluation at this time Most important thing would be to prevent pressure sores or injury to feet as ulcerations or injury will be slow or unlikely to heal No need for return appointment from vascular surgery standpoint

## 2013-06-13 NOTE — ED Provider Notes (Signed)
I saw and evaluated the patient, reviewed the resident's note and I agree with the findings and plan.   EKG Interpretation   Date/Time:  Thursday May 24 2013 16:13:02 EST Ventricular Rate:  82 PR Interval:  207 QRS Duration: 90 QT Interval:  404 QTC Calculation: 472 R Axis:   -27 Text Interpretation:  Sinus rhythm Aberrant conduction of SV complex(es)  Probable left atrial enlargement Borderline left axis deviation Borderline  low voltage, extremity leads Confirmed by Rubin PayorPICKERING  MD, Khushbu Pippen ([id number]) on  05/26/2013 3:15:22 PM     Patient with fall. Admission for pneumonia  Juliet Rudeathan R. Rubin PayorPickering, MD 06/13/13 1251

## 2014-02-21 ENCOUNTER — Encounter (HOSPITAL_BASED_OUTPATIENT_CLINIC_OR_DEPARTMENT_OTHER): Payer: MEDICARE | Attending: Internal Medicine

## 2014-02-21 DIAGNOSIS — I739 Peripheral vascular disease, unspecified: Secondary | ICD-10-CM | POA: Insufficient documentation

## 2014-02-21 DIAGNOSIS — L97419 Non-pressure chronic ulcer of right heel and midfoot with unspecified severity: Secondary | ICD-10-CM | POA: Insufficient documentation

## 2014-02-21 DIAGNOSIS — L97519 Non-pressure chronic ulcer of other part of right foot with unspecified severity: Secondary | ICD-10-CM | POA: Diagnosis not present

## 2014-02-21 DIAGNOSIS — L97329 Non-pressure chronic ulcer of left ankle with unspecified severity: Secondary | ICD-10-CM | POA: Diagnosis not present

## 2014-02-21 NOTE — Progress Notes (Signed)
[facility]  NAME:  [name], [name]              ACCOUNT NO.:  [id number]  MEDICAL RECORD NO.:  [id number]      DATE OF BIRTH:  1924-07-22  PHYSICIAN:  Maxwell CaulMichael G. Robson, M.D. VISIT DATE:  02/21/2014                                  OFFICE VISIT   [facility]  HISTORY OF PRESENT ILLNESS:  This is an 80 year old woman who is here for our review of nonhealing wounds on her right greater than left lower extremities.  In reviewing her chart on [facility] is notable for very significant PAD.  Noninvasive vascular studies from February of this year showed symmetric moderately severe bilateral occlusive disease. The level of the disease appeared to be at the superficial femoral artery.  Followup studies on March 3rd, suggested ABIs of 0.6 on the right and 0.64 on the left.  Distal vessels bilaterally were not visible.  Monophasic waveforms were noted at the superficial femoral artery in the mid and distal areas.  The patient went on to have a review by Dr. Hart RochesterLawson on June 12, 2013.  At that point, she did not have wounds on her feet.  Dr. Hart RochesterLawson noted severe diffuse peripheral vascular occlusive disease in both lower extremities without evidence of claudication at rest or nonhealing ulcers, infection or gangrene. She was not felt to be ambulatory due to dementia.  Sometime after this roughly 5 or 6 months ago, she developed wounds in her bilateral feet.  She has had an Assisted Living.  She has a med assist home health there and they are putting silver alginate on these wounds.  They have provided bunny boots.  I do not have a sense of how these have progressed, but we are seeing her today in relation to this.  PAST MEDICAL HISTORY:  Includes moderate dementia, hyperlipidemia, arthritis, hereditary peripheral neuropathy, premature ventricular contractions, vertigo, history of mitral valve prolapse.  PAST SURGICAL HISTORY:  Includes D  and C, tonsillectomy and bunionectomy.  CURRENT MEDICATIONS:  Include Aricept 10 at bedtime, Ensure 3 times a day, furosemide 20 daily, Namenda 10 daily, metoprolol 25 XL daily, Remeron 30 daily, tramadol 50 b.i.d., trazodone 25 at bedtime, vitamin D 50,000 units weekly and Ativan p.r.n.  PHYSICAL EXAMINATION:  GENERAL:  This is a frail woman with a significant cognitive impairment. VITALS:  Pulse is 68, respirations 18, blood pressure 127/75. RESPIRATORY:  Clear air entry bilaterally. CARDIAC:  Heart sounds were normal, soft mid systolic murmur.  I wondered about some degree of volume contraction. WOUND:  Most substantially is a very large deep wound on the right lateral ankle just above the malleolus measuring 1.8 x 1.7 x 0.4.  She has smaller wounds on the right lateral foot measuring 0.9 x 1.4 x 0.1 and on the left heel a 1.2 x 0.9 x 0.1.  Her forefeet are cool. Peripheral pulses are not palpable even at the popliteal.  IMPRESSIONS:  Severe peripheral arterial disease now with breakdown. These wounds are very tender.  I could not even attempt to get close enough at these to debride and the larger wound on the right lateral ankle probably needs some debridement.  I do not see any current evidence of infection.  I have changed the dressings to silver collagen which  can be changed 3 times a week with Hydrogel.  Her son is present. I have explained that I think it is unlikely that anything further can be done about this situation.  I think he was interested in Dr. Candie ChromanLawson's opinion about whether anything could be done to revascularize, although from the tone of his previous notes I don't believe this is thought to be possible.  At some point in time, amputation may be necessary and I will explain this to the son as well.  Although for now, she does not appear to be in unrelenting pain and there is certainly no evidence of infection.  I have arranged a followup appointment with Dr.  Hart RochesterLawson, and we will continue to follow her here along with a med assist home health.          ______________________________ Maxwell CaulMichael G. Robson, M.D.     MGR/MEDQ  D:  02/21/2014  T:  02/21/2014  Job:  [id number]

## 2014-02-26 ENCOUNTER — Ambulatory Visit: Payer: MEDICARE | Admitting: Vascular Surgery

## 2014-02-26 ENCOUNTER — Other Ambulatory Visit (HOSPITAL_COMMUNITY): Payer: MEDICARE

## 2014-02-27 ENCOUNTER — Encounter: Payer: Self-pay | Admitting: Vascular Surgery

## 2014-02-27 ENCOUNTER — Other Ambulatory Visit: Payer: Self-pay

## 2014-02-27 DIAGNOSIS — I70244 Atherosclerosis of native arteries of left leg with ulceration of heel and midfoot: Secondary | ICD-10-CM

## 2014-02-27 DIAGNOSIS — I739 Peripheral vascular disease, unspecified: Secondary | ICD-10-CM

## 2014-02-27 DIAGNOSIS — I70233 Atherosclerosis of native arteries of right leg with ulceration of ankle: Secondary | ICD-10-CM

## 2014-03-14 ENCOUNTER — Encounter (HOSPITAL_BASED_OUTPATIENT_CLINIC_OR_DEPARTMENT_OTHER): Payer: MEDICARE | Attending: Internal Medicine

## 2014-03-14 DIAGNOSIS — L97312 Non-pressure chronic ulcer of right ankle with fat layer exposed: Secondary | ICD-10-CM | POA: Diagnosis not present

## 2014-03-14 DIAGNOSIS — L97421 Non-pressure chronic ulcer of left heel and midfoot limited to breakdown of skin: Secondary | ICD-10-CM | POA: Insufficient documentation

## 2014-03-14 DIAGNOSIS — L97412 Non-pressure chronic ulcer of right heel and midfoot with fat layer exposed: Secondary | ICD-10-CM | POA: Diagnosis present

## 2014-03-15 ENCOUNTER — Ambulatory Visit (HOSPITAL_COMMUNITY)
Admission: RE | Admit: 2014-03-15 | Discharge: 2014-03-15 | Disposition: A | Discharge status: Home / Self Care | Payer: MEDICARE | Source: Ambulatory Visit | Attending: Vascular Surgery | Admitting: Vascular Surgery

## 2014-03-15 DIAGNOSIS — I739 Peripheral vascular disease, unspecified: Secondary | ICD-10-CM

## 2014-03-15 DIAGNOSIS — I70244 Atherosclerosis of native arteries of left leg with ulceration of heel and midfoot: Secondary | ICD-10-CM | POA: Diagnosis not present

## 2014-03-15 DIAGNOSIS — I70233 Atherosclerosis of native arteries of right leg with ulceration of ankle: Secondary | ICD-10-CM | POA: Diagnosis not present

## 2014-03-15 DIAGNOSIS — Z87891 Personal history of nicotine dependence: Secondary | ICD-10-CM | POA: Diagnosis not present

## 2014-03-15 DIAGNOSIS — E785 Hyperlipidemia, unspecified: Secondary | ICD-10-CM | POA: Insufficient documentation

## 2014-03-18 ENCOUNTER — Encounter: Payer: Self-pay | Admitting: Vascular Surgery

## 2014-03-19 ENCOUNTER — Encounter: Payer: Self-pay | Admitting: Vascular Surgery

## 2014-03-19 ENCOUNTER — Ambulatory Visit (INDEPENDENT_AMBULATORY_CARE_PROVIDER_SITE_OTHER): Payer: MEDICARE | Admitting: Vascular Surgery

## 2014-03-19 VITALS — BP 97/48 | HR 104 | Ht 64.0 in | Wt 115.0 lb

## 2014-03-19 DIAGNOSIS — I70233 Atherosclerosis of native arteries of right leg with ulceration of ankle: Secondary | ICD-10-CM

## 2014-03-19 DIAGNOSIS — I739 Peripheral vascular disease, unspecified: Secondary | ICD-10-CM

## 2014-03-19 NOTE — Progress Notes (Signed)
Subjective:     Patient ID: [name], female   DOB: [birth year], 80 y.o.   MRN: [id number]  HPI this 80 year old female was referred by Dr. Baltazar NajjarMichael Robson at the wound center for reevaluation of lower extremity occlusive disease. The patient has dementia and is nonambulatory. I saw her in March 2015 and it was felt that she had diffuse superficial femoral and tibial occlusive disease. At that time she had no ulcerations. She now has some nonhealing ulcers in the right foot and on the left heel which are being treated at the wound center. She is accompanied by her son. She denies rest pain which is severe. She does have local wound care provided. She has no history of infection,drainage, gangrene, or cellulitis.  Past Medical History  Diagnosis Date  . Dyslipidemia   . Alopecia   . Arthritis   . Hereditary peripheral neuropathy(356.0)     neurology eval 3/12  . Hereditary neuropathy with pressure palsies (HNPP)   . PVC (premature ventricular contraction)     history  . Vertigo history  . Neuropathy     history  . Hyperlipidemia   . Hx of mitral valve prolapse     mild prolapse with mitral regurgitation  . Peripheral arterial disease   . Moderate dementia     History  Substance Use Topics  . Smoking status: Former Smoker -- 1.00 packs/day for 8 years    Quit date: 07/01/1963  . Smokeless tobacco: Never Used  . Alcohol Use: 2.4 oz/week    4 Shots of liquor per week     Comment: short one before I go to bed 1-2oz    Family History  Problem Relation Age of Onset  . Other Father     heart valve infection  . Stroke Mother   . Diabetes Mother   . Heart disease Mother   . Hyperlipidemia Mother   . Hypertension Mother   . Heart disease Brother     Allergies  Allergen Reactions  . Other Other (See Comments)    Uncoded Allergy. Allergen: bananas    Current outpatient prescriptions: Ascorbic Acid (VITAMIN C) 1000 MG tablet, Take 1,000 mg by mouth daily., Disp: , Rfl: ;   donepezil (ARICEPT) 10 MG tablet, Take 1 tablet (10 mg total) by mouth at bedtime., Disp: 30 tablet, Rfl: 12;  furosemide (LASIX) 20 MG tablet, Take 20 mg by mouth., Disp: , Rfl:  guaiFENesin-dextromethorphan (ROBITUSSIN DM) 100-10 MG/5ML syrup, Take 5 mLs by mouth every 4 (four) hours as needed for cough., Disp: 118 mL, Rfl: 0;  levofloxacin (LEVAQUIN) 750 MG tablet, Take 1 tablet (750 mg total) by mouth daily., Disp: , Rfl: ;  LORazepam (ATIVAN) 0.5 MG tablet, Take 0.5 mg by mouth every 8 (eight) hours., Disp: , Rfl:  meclizine (ANTIVERT) 12.5 MG tablet, Take 12.5 mg by mouth 3 (three) times daily as needed for dizziness., Disp: , Rfl: ;  Memantine HCl ER (NAMENDA XR) 28 MG CP24, Take 28 mg by mouth daily., Disp: 30 capsule, Rfl: 12;  metoprolol succinate (TOPROL-XL) 25 MG 24 hr tablet, Take 25 mg by mouth daily., Disp: , Rfl: ;  mirtazapine (REMERON) 15 MG tablet, Take 30 mg by mouth at bedtime. , Disp: , Rfl:  Multiple Vitamins-Minerals (OCUVITE EYE HEALTH FORMULA) CAPS, Take 2 capsules by mouth 2 (two) times daily.  , Disp: , Rfl: ;  simvastatin (ZOCOR) 20 MG tablet, Take 20 mg by mouth daily., Disp: , Rfl: ;  traMADol (ULTRAM) 50  MG tablet, Take by mouth 2 (two) times daily., Disp: , Rfl: ;  traZODone (DESYREL) 25 mg TABS tablet, Take 25 mg by mouth at bedtime., Disp: , Rfl:  Vitamin D, Ergocalciferol, (DRISDOL) 50000 UNITS CAPS capsule, Take 50,000 Units by mouth every 7 (seven) days., Disp: , Rfl: ;  vitamin E 100 UNIT capsule, Take 100 Units by mouth daily., Disp: , Rfl:   BP 97/48 mmHg  Pulse 104  Ht 5\' 4"  (1.626 m)  Wt 115 lb (52.164 kg)  BMI 19.73 kg/m2  SpO2 91%  Body mass index is 19.73 kg/(m^2).         Review of Systems patient has dementia and does not provide reliable    historical information. Objective:   Physical Exam BP 97/48 mmHg  Pulse 104  Ht 5\' 4"  (1.626 m)  Wt 115 lb (52.164 kg)  BMI 19.73 kg/m2  SpO2 91%  Gen.-alert and oriented x3 in no apparent  distress-elderly-frail in appearance-in a wheelchair-does not respond to questions consistently HEENT normal for age Lungs no rhonchi or wheezing Cardiovascular regular rhythm no murmurs carotid pulses 3+ palpable no bruits audible Abdomen soft nontender no palpable masses Musculoskeletal free of  major deformities Skin clear -no rashes Neurologic normal Lower extremities 3+ femoral pulses bilaterally. No popliteal or distal pulses palpable. Right lower extremity with circular ulceration over lateral malleolus measuring about 1-1/2 cm in diameter and second ulcer with eschar over lateral foot over fifth metatarsal bone. No gangrene or pertinent drainage in either area and no cellulitis. Left foot with ulcer over heel measuring 1-1/2 cm in diameter. No bone exposed. No active infection noted.  Today I reviewed ABIs which were performed in our lab on 03/15/2014 and compared these 2 studies done in March 2015. These are essentially unchanged with dampened monophasic flow bilaterally with ABI approximately 0.6 on the right and 0.64 on the left previously and now 0.59 on the right and 0.66 on the left.       Assessment:     Nonambulatory patient with chronic dementia with nonhealing ulcers bilateral lower extremities due to diffuse superficial femoral and tibial occlusive disease Patient is not an operative candidate for revascularization Does not seem to be having severe rest pain at the present time or infection which would indicate that she needs amputation in the near future     Plan:     If patient develops progressive ulceration and infection or rest pain will likely need amputation of extremity-possibly bilateral. Would probably be best served with above knee amputation to be certain that this heals if situation comes to this point. I discussed this with her son and he does understand this and I did answer his questions.

## 2014-03-26 ENCOUNTER — Other Ambulatory Visit (HOSPITAL_COMMUNITY): Payer: MEDICARE

## 2014-03-26 ENCOUNTER — Ambulatory Visit: Payer: MEDICARE | Admitting: Vascular Surgery

## 2014-03-28 DIAGNOSIS — L97412 Non-pressure chronic ulcer of right heel and midfoot with fat layer exposed: Secondary | ICD-10-CM | POA: Diagnosis not present

## 2014-03-28 DIAGNOSIS — L97312 Non-pressure chronic ulcer of right ankle with fat layer exposed: Secondary | ICD-10-CM | POA: Diagnosis not present

## 2014-03-28 DIAGNOSIS — L97421 Non-pressure chronic ulcer of left heel and midfoot limited to breakdown of skin: Secondary | ICD-10-CM | POA: Diagnosis not present

## 2014-04-18 ENCOUNTER — Encounter (HOSPITAL_BASED_OUTPATIENT_CLINIC_OR_DEPARTMENT_OTHER): Payer: MEDICARE | Attending: Internal Medicine

## 2014-06-11 DEATH — deceased

## 2014-10-27 IMAGING — CR DG CHEST 2V
1 series · 1 of 1 positions shown · non-contrast
Comparison: Prior chest x-ray 12/29/2009

CLINICAL DATA: Fall, altered mental status

EXAM:
CHEST  2 VIEW

[view not recorded]
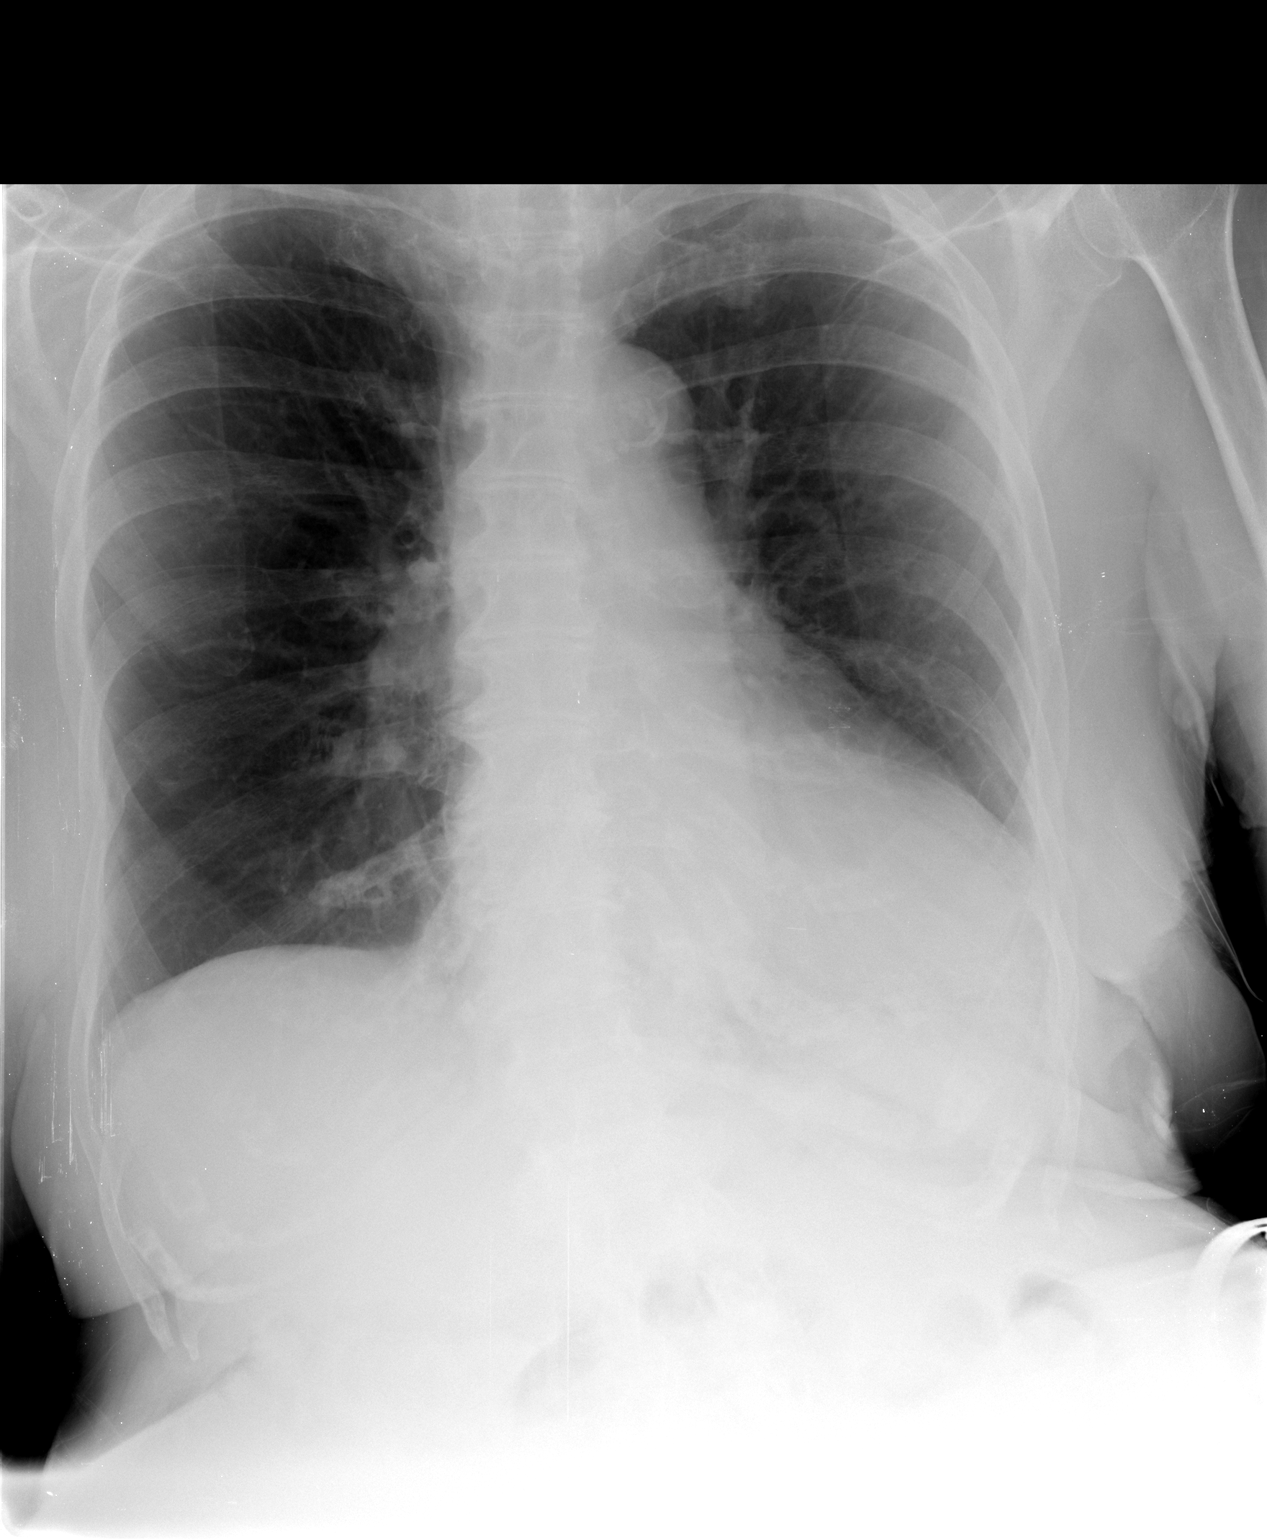

[1 of 1 positions shown; findings below may reference images not displayed]

FINDINGS: Interval development of patchy left lower lobe opacity which may
reflect atelectasis or infiltrate. Stable mild cardiomegaly.
Atherosclerotic calcifications noted in the transverse aorta. Stable
background changes of mild chronic interstitial prominence and
bronchitic changes. No pneumothorax or large effusion. No acute
osseous abnormality.
IMPRESSION: 1. Patchy left lower lobe opacity may reflect atelectasis or
infiltrate.
2. Stable cardiomegaly.
3. Aortic atherosclerosis.

## 2014-10-28 IMAGING — CR DG KNEE COMPLETE 4+V*R*
4 series · 4 of 4 positions shown · non-contrast
Comparison: None.

CLINICAL DATA: Fall pain

EXAM:
RIGHT KNEE - COMPLETE 4+ VIEW

[x knee lat right (1 of 4)]
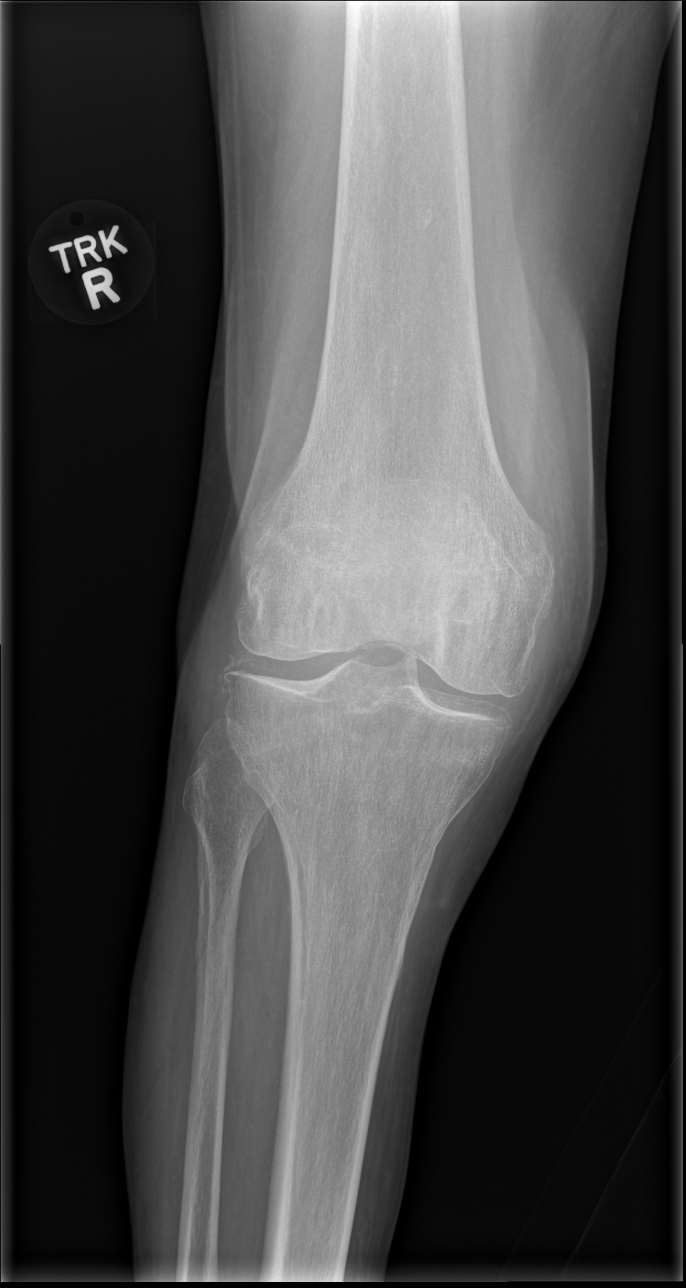

[x knee lat right (2 of 4)]
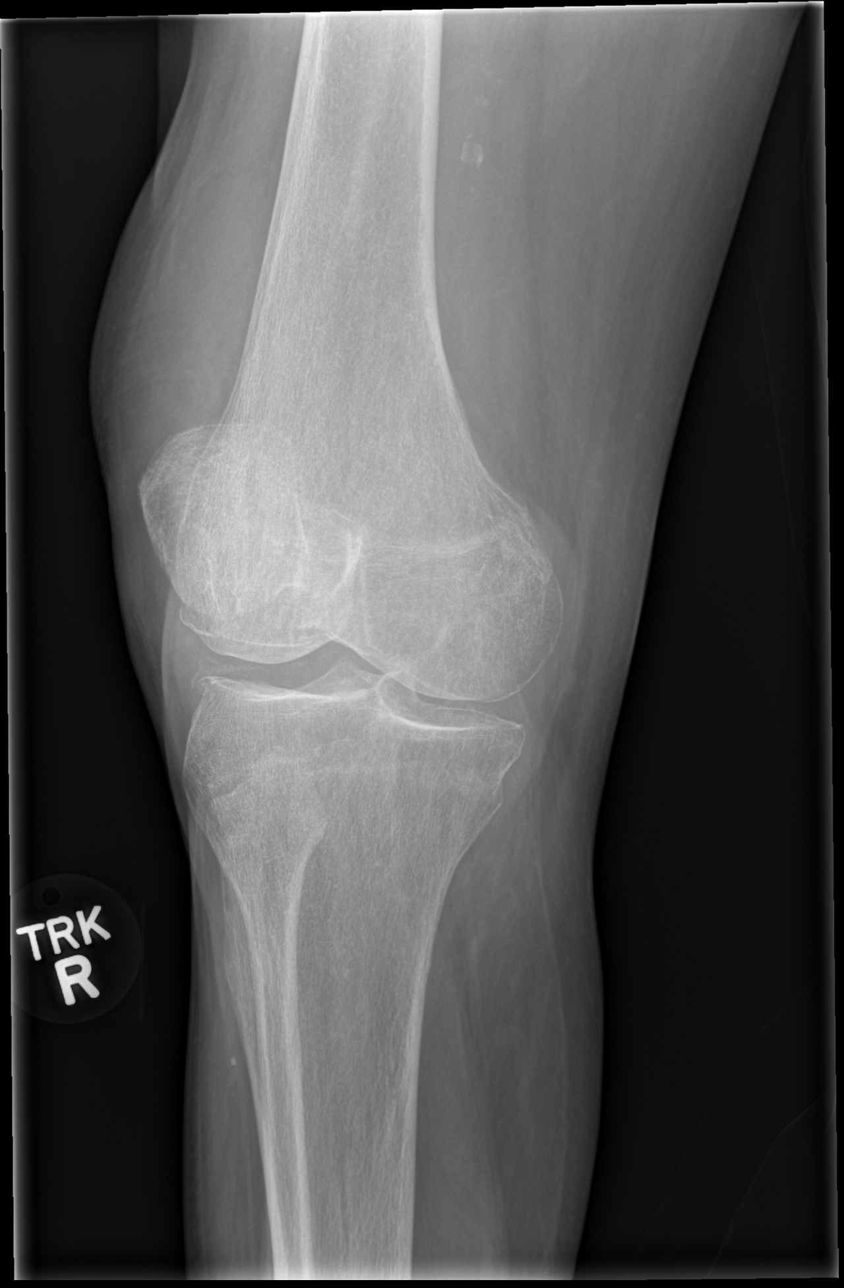

[x knee lat right (3 of 4)]
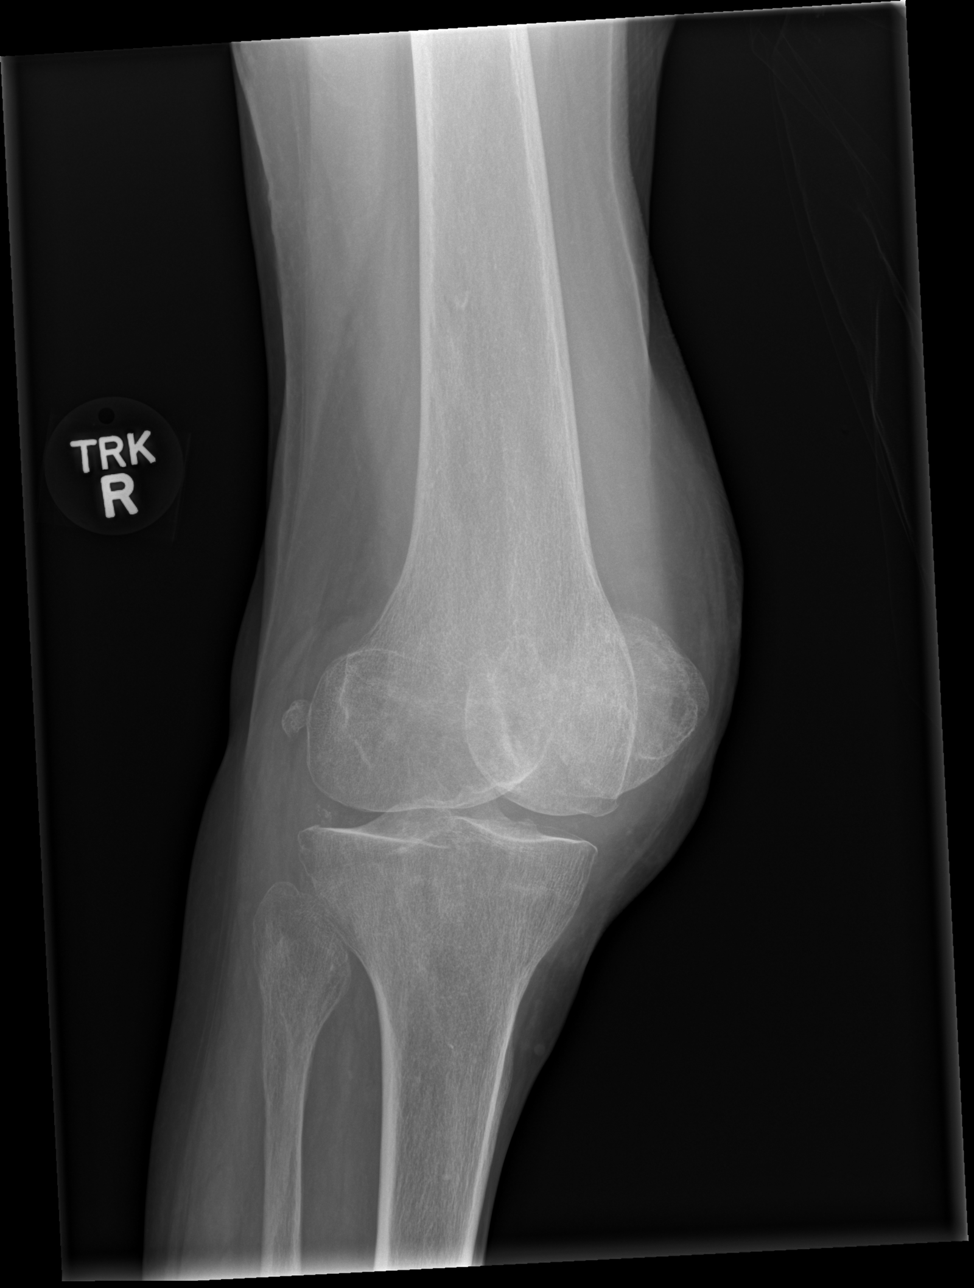

[x knee lat right (4 of 4)]
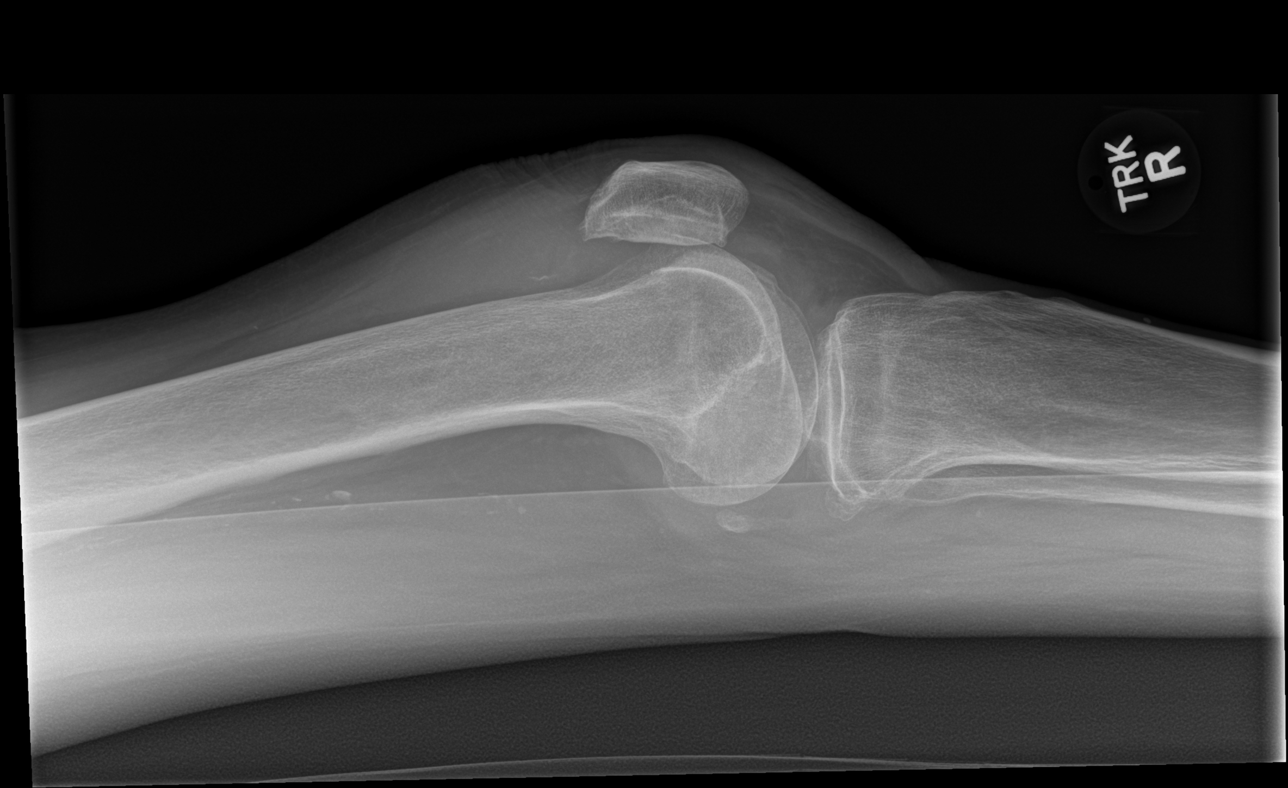

[4 of 4 positions shown; findings below may reference images not displayed]

FINDINGS: Calcification of the lateral meniscus. Mild lateral compartment
osteophyte formation. No significant joint space narrowing. Femoral
artery calcification. There is a joint effusion, small to moderate.
No fracture or dislocation.
IMPRESSION: Joint effusion.  No acute osseous abnormalities.

## 2015-02-05 NOTE — Telephone Encounter (Signed)
Error

## 2016-12-27 ENCOUNTER — Other Ambulatory Visit: Payer: Self-pay | Admitting: Obstetrics and Gynecology

## 2016-12-27 DIAGNOSIS — Z1231 Encounter for screening mammogram for malignant neoplasm of breast: Secondary | ICD-10-CM
